# Patient Record
Sex: Female | Born: 1950 | Race: White | Hispanic: No | Smoking: Never smoker
Health system: Southern US, Community
[De-identification: ages and names within clinical notes are randomized; demographics above are authoritative.]

## PROBLEM LIST (undated history)

## (undated) DIAGNOSIS — C50919 Malignant neoplasm of unspecified site of unspecified female breast: Secondary | ICD-10-CM

## (undated) DIAGNOSIS — Z1379 Encounter for other screening for genetic and chromosomal anomalies: Principal | ICD-10-CM

## (undated) DIAGNOSIS — M1990 Unspecified osteoarthritis, unspecified site: Secondary | ICD-10-CM

## (undated) DIAGNOSIS — C801 Malignant (primary) neoplasm, unspecified: Secondary | ICD-10-CM

## (undated) DIAGNOSIS — Z923 Personal history of irradiation: Secondary | ICD-10-CM

## (undated) DIAGNOSIS — K759 Inflammatory liver disease, unspecified: Secondary | ICD-10-CM

## (undated) HISTORY — DX: Encounter for other screening for genetic and chromosomal anomalies: Z13.79

## (undated) HISTORY — PX: BREAST LUMPECTOMY: SHX2

## (undated) HISTORY — PX: ANTERIOR CRUCIATE LIGAMENT REPAIR: SHX115

---

## 1999-01-29 ENCOUNTER — Other Ambulatory Visit: Admission: RE | Admit: 1999-01-29 | Discharge: 1999-01-29 | Payer: Self-pay | Admitting: Obstetrics and Gynecology

## 2000-03-02 ENCOUNTER — Other Ambulatory Visit: Admission: RE | Admit: 2000-03-02 | Discharge: 2000-03-02 | Payer: Self-pay | Admitting: Obstetrics and Gynecology

## 2000-06-23 ENCOUNTER — Ambulatory Visit (HOSPITAL_COMMUNITY): Admission: RE | Admit: 2000-06-23 | Discharge: 2000-06-23 | Payer: Self-pay | Admitting: Gastroenterology

## 2001-06-20 ENCOUNTER — Other Ambulatory Visit: Admission: RE | Admit: 2001-06-20 | Discharge: 2001-06-20 | Payer: Self-pay | Admitting: Obstetrics and Gynecology

## 2002-09-19 ENCOUNTER — Other Ambulatory Visit: Admission: RE | Admit: 2002-09-19 | Discharge: 2002-09-19 | Payer: Self-pay | Admitting: Obstetrics and Gynecology

## 2003-09-10 ENCOUNTER — Other Ambulatory Visit: Admission: RE | Admit: 2003-09-10 | Discharge: 2003-09-10 | Payer: Self-pay | Admitting: Obstetrics and Gynecology

## 2010-02-11 ENCOUNTER — Ambulatory Visit: Payer: Self-pay

## 2010-02-11 ENCOUNTER — Encounter: Payer: Self-pay | Admitting: Cardiovascular Disease

## 2010-02-11 DIAGNOSIS — M79609 Pain in unspecified limb: Secondary | ICD-10-CM | POA: Insufficient documentation

## 2010-11-27 NOTE — Miscellaneous (Signed)
Summary: Orders Update  Clinical Lists Changes  Problems: Added new problem of LEG PAIN (ICD-729.5) Orders: Added new Test order of Venous Duplex Lower Extremity (Venous Duplex Lower) - Signed

## 2011-03-13 NOTE — Procedures (Signed)
[facility]  Patient:    Jessica Ingram, Jessica Ingram                        MRN: [id number] Proc. Date: 06/23/00 Adm. Date:  [id number] Attending:  Charna Elizabeth CC:         Jessica Ingram, M.D.                           Procedure Report  DATE OF BIRTH:  10-13-1951  PROCEDURE PERFORMED:  Colonoscopy.  ENDOSCOPIST:  Anselmo Rod, M.D.  INSTRUMENT USED:  Olympus video colonoscope.  INDICATION FOR PROCEDURE:  Abnormal rectal exam with question of a polyp on rectal exam in a 60 year old white female, rule out colonic polyps, masses, hemorrhoids, et Karie Soda.  PREPROCEDURE PREPARATION:  Informed consent was procured from the patient. The patient was fasted for eight hours prior to the procedure and prepped with a bottle of Magnesium Citrate and a gallon of NuLYTELY the night prior to procedure.  PREPROCEDURE PHYSICAL:  VITAL SIGNS:  The patient had stable vital signs. NECK:  Supple.  CHEST:  Clear to auscultation.  HEART:  S1, S2 regular. ABDOMEN:  Soft, with normal abdominal bowel sounds.  DESCRIPTION OF PROCEDURE:  The patient was placed in the left lateral decubitus position and sedated with 80 mg of Demerol and 8 mg of Versed intravenously.  Once the patient was adequately sedated, maintained on low flow oxygen and continuous cardiac monitoring, the Olympus video colonoscope was advanced from the rectum to the cecum with slight difficulty because of extreme spasm in the left colon.  The patient had extensive left-sided diverticular disease with stool in some of the diverticular pockets.  No masses, polyps, erosions, ulcerations were seen.  She also had small internal hemorrhoids.  On examination of the rectum, no masses or polyps were seen. The patient tolerated the procedure well without complication.  The rest of the colon up to the cecum appeared normal.  The cecum, right colon and transverse colon were healthy without any  abnormalities.  IMPRESSION: 1.  Left-sided diverticular disease. 2.  No large masses or polyps seen. 3.  Small internal hemorrhoids on retroflexion in the rectum.  No colonic     masses or polyps seen and the rectum was also free of masses.  RECOMMENDATION: 1.  The patient will be advised to increase her fluid and fiber in her diet.     A high fiber diet has been discussed with her in great detail and     brochures have been given to her for education. 2.  She is to follow up Dr. Rosalio Ingram and contact me for further GI problems. DD:  06/23/00 TD:  06/23/00 Job: [id number]

## 2014-11-15 ENCOUNTER — Other Ambulatory Visit: Payer: Self-pay | Admitting: Gastroenterology

## 2014-11-15 DIAGNOSIS — R1032 Left lower quadrant pain: Secondary | ICD-10-CM

## 2014-11-19 ENCOUNTER — Ambulatory Visit
Admission: RE | Admit: 2014-11-19 | Discharge: 2014-11-19 | Disposition: A | Discharge status: Home / Self Care | Payer: BLUE CROSS/BLUE SHIELD | Source: Ambulatory Visit | Attending: Gastroenterology | Admitting: Gastroenterology

## 2014-11-19 DIAGNOSIS — R1032 Left lower quadrant pain: Secondary | ICD-10-CM

## 2014-11-19 MED ORDER — IOHEXOL 300 MG/ML  SOLN
100.0000 mL | Freq: Once | INTRAMUSCULAR | Status: AC | PRN
  Administered 2014-11-19: 100 mL via INTRAVENOUS

## 2016-10-26 HISTORY — PX: BREAST BIOPSY: SHX20

## 2017-01-13 ENCOUNTER — Telehealth: Payer: Self-pay | Admitting: *Deleted

## 2017-01-13 NOTE — Telephone Encounter (Signed)
Left message for a return phone call to schedule for [facility] 3/28.

## 2017-01-14 ENCOUNTER — Telehealth: Payer: Self-pay | Admitting: *Deleted

## 2017-01-14 NOTE — Telephone Encounter (Signed)
Confirmed [facility] for 01/20/17 at 1215pm .  Instructions and contact information given.

## 2017-01-15 ENCOUNTER — Other Ambulatory Visit: Payer: Self-pay | Admitting: *Deleted

## 2017-01-15 DIAGNOSIS — Z170 Estrogen receptor positive status [ER+]: Secondary | ICD-10-CM

## 2017-01-15 DIAGNOSIS — C50211 Malignant neoplasm of upper-inner quadrant of right female breast: Secondary | ICD-10-CM | POA: Insufficient documentation

## 2017-01-20 ENCOUNTER — Ambulatory Visit (HOSPITAL_BASED_OUTPATIENT_CLINIC_OR_DEPARTMENT_OTHER): Payer: BLUE CROSS/BLUE SHIELD | Admitting: Oncology

## 2017-01-20 ENCOUNTER — Encounter: Payer: Self-pay | Admitting: Physical Therapy

## 2017-01-20 ENCOUNTER — Ambulatory Visit: Payer: BLUE CROSS/BLUE SHIELD | Attending: General Surgery | Admitting: Physical Therapy

## 2017-01-20 ENCOUNTER — Encounter: Payer: Self-pay | Admitting: Radiation Oncology

## 2017-01-20 ENCOUNTER — Other Ambulatory Visit (HOSPITAL_BASED_OUTPATIENT_CLINIC_OR_DEPARTMENT_OTHER): Payer: BLUE CROSS/BLUE SHIELD

## 2017-01-20 ENCOUNTER — Ambulatory Visit
Admission: RE | Admit: 2017-01-20 | Discharge: 2017-01-20 | Disposition: A | Discharge status: Home / Self Care | Payer: BLUE CROSS/BLUE SHIELD | Source: Ambulatory Visit | Attending: Radiation Oncology | Admitting: Radiation Oncology

## 2017-01-20 ENCOUNTER — Other Ambulatory Visit: Payer: Self-pay | Admitting: *Deleted

## 2017-01-20 ENCOUNTER — Encounter: Payer: Self-pay | Admitting: Oncology

## 2017-01-20 VITALS — BP 148/82 | HR 82 | Temp 97.6°F | Resp 18 | Ht 66.0 in | Wt 137.5 lb

## 2017-01-20 DIAGNOSIS — R293 Abnormal posture: Secondary | ICD-10-CM

## 2017-01-20 DIAGNOSIS — C50211 Malignant neoplasm of upper-inner quadrant of right female breast: Secondary | ICD-10-CM

## 2017-01-20 DIAGNOSIS — Z170 Estrogen receptor positive status [ER+]: Secondary | ICD-10-CM

## 2017-01-20 LAB — CBC WITH DIFFERENTIAL/PLATELET
BASO%: 0 % (ref 0.0–2.0)
Basophils Absolute: 0 10*3/uL (ref 0.0–0.1)
EOS%: 0.4 % (ref 0.0–7.0)
Eosinophils Absolute: 0 10*3/uL (ref 0.0–0.5)
HCT: 40.7 % (ref 34.8–46.6)
HGB: 14.1 g/dL (ref 11.6–15.9)
LYMPH%: 31.1 % (ref 14.0–49.7)
MCH: 33.1 pg (ref 25.1–34.0)
MCHC: 34.6 g/dL (ref 31.5–36.0)
MCV: 95.5 fL (ref 79.5–101.0)
MONO#: 0.4 10*3/uL (ref 0.1–0.9)
MONO%: 9.6 % (ref 0.0–14.0)
NEUT#: 2.7 10*3/uL (ref 1.5–6.5)
NEUT%: 58.9 % (ref 38.4–76.8)
Platelets: 183 10*3/uL (ref 145–400)
RBC: 4.26 10*6/uL (ref 3.70–5.45)
RDW: 12.3 % (ref 11.2–14.5)
WBC: 4.6 10*3/uL (ref 3.9–10.3)
lymph#: 1.4 10*3/uL (ref 0.9–3.3)

## 2017-01-20 LAB — COMPREHENSIVE METABOLIC PANEL
ALT: 20 U/L (ref 0–55)
AST: 20 U/L (ref 5–34)
Albumin: 4.7 g/dL (ref 3.5–5.0)
Alkaline Phosphatase: 58 U/L (ref 40–150)
Anion Gap: 11 mEq/L (ref 3–11)
BUN: 12.9 mg/dL (ref 7.0–26.0)
CO2: 26 mEq/L (ref 22–29)
Calcium: 9.9 mg/dL (ref 8.4–10.4)
Chloride: 103 mEq/L (ref 98–109)
Creatinine: 1 mg/dL (ref 0.6–1.1)
EGFR: 63 mL/min/{1.73_m2} — ABNORMAL LOW (ref >90)
Glucose: 138 mg/dl (ref 70–140)
Potassium: 3.9 mEq/L (ref 3.5–5.1)
Sodium: 140 mEq/L (ref 136–145)
Total Bilirubin: 0.58 mg/dL (ref 0.20–1.20)
Total Protein: 7.9 g/dL (ref 6.4–8.3)

## 2017-01-20 NOTE — Progress Notes (Addendum)
Radiation Oncology         [phone] ________________________________  Name: [name] MRN: [id number]  Date: 01/20/2017  DOB: July 05, 1951    Attestation Please see the note from Shona Simpson, PA-C from today's visit for more details of today's encounter.  I have personally performed a face to face diagnostic evaluation on this patient and devised the following assessment and plan.  The patient was seen today in multidisciplinary breast clinic. She has what appears to be an early right-sided breast tumor, 1.1 cm with no suspicious axillary lymph nodes on ultrasound. For this side, the patient appears to be a good candidate for breast conservation treatment. She will undergo an MRI scan and at this time it is anticipated that she will proceed with a right-sided lumpectomy and sentinel lymph node biopsy. An Oncotype test will be ordered. At this time, she appears to be a good candidate for a hypo-fractionated course of radiation treatment to the right breast.  The patient also has a potential abnormality within the left breast. This is going to be worked up further and likely the patient will undergo a lumpectomy on this side as well with the benign findings on biopsy being felt to be discordant with imaging findings. We discussed that if she does have breast cancer confirmed on this side, then she may benefit from adjuvant radiation treatment to this site post lumpectomy as well.   Staging: cT1cN0M0 right breast  Kyung Rudd, MD    GQ:[id number], Loma Sousa, PA-C  Fanny Skates, MD     REFERRING PHYSICIAN: Fanny Skates, MD   DIAGNOSIS: The encounter diagnosis was Malignant neoplasm of upper-inner quadrant of right breast in female, estrogen receptor positive ([place]).   HISTORY OF PRESENT ILLNESS: Jessica Ingram is a 66 y.o. female seen for a new diagnosis of right breast cancer. The patient was found to have screening dectected abnormality of bilateral breasts which prompted  ultrasound. Within the right breast there was a 1.1 cm mass, and the axilla was negative. On the left, there is a 9 mm lesion as well, and axilla was also negative. 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 The left breast biopsy revealed fibrocystic change and was felt to be discordant. She comes today for evaluation and for recommendations of care for her breast cancer.   PREVIOUS RADIATION THERAPY: No   PAST MEDICAL HISTORY:  Past Medical History:  Diagnosis Date  . Gestational diabetes        PAST SURGICAL HISTORY: Past Surgical History:  Procedure Laterality Date  . ANTERIOR CRUCIATE LIGAMENT REPAIR Left      FAMILY HISTORY:  Family History  Problem Relation Age of Onset  . Lung cancer Mother   . Prostate cancer Father   . Colon cancer Brother   . Breast cancer Maternal Aunt   . Colon cancer Maternal Aunt      SOCIAL HISTORY:  reports that she has never smoked. She has never used smokeless tobacco. She reports that she drinks alcohol. The patient is married and lives in [place]. She works in a Sales promotion account executive.    ALLERGIES: Patient has no known allergies.   MEDICATIONS:  No current outpatient prescriptions on file.   No current facility-administered medications for this encounter.      REVIEW OF SYSTEMS: On review of systems, the patient reports that she is doing well overall. She denies any chest pain, shortness of breath, cough,  fevers, chills, night sweats, unintended weight changes. She denies any bowel or bladder disturbances, and denies abdominal pain, nausea or vomiting. She denies any new musculoskeletal or joint aches or pains. A complete review of systems is obtained and is otherwise negative.     PHYSICAL EXAM:  Wt Readings from Last 3 Encounters:  01/20/17 137 lb 8 oz (62.4 kg)   Temp Readings from Last 3 Encounters:  01/20/17 97.6 F (36.4 C)  (Oral)   BP Readings from Last 3 Encounters:  01/20/17 (!) 148/82   Pulse Readings from Last 3 Encounters:  01/20/17 82     In general this is a well appearing Caucasian female in no acute distress. She is alert and oriented x4 and appropriate throughout the examination. HEENT reveals that the patient is normocephalic, atraumatic. EOMs are intact. PERRLA. Skin is intact without any evidence of gross lesions. Cardiovascular exam reveals a regular rate and rhythm, no clicks rubs or murmurs are auscultated. Chest is clear to auscultation bilaterally. Lymphatic assessment is performed and does not reveal any adenopathy in the cervical, supraclavicular, axillary, or inguinal chains. Bilateral breasts are examined and exhibit post biopsy change. There is induration of the right breast deep to the biopsy site consistent with her tumor, though this is somewhat subtle. No nipple bleeding or discharge is noted. Abdomen has active bowel sounds in all quadrants and is intact. The abdomen is soft, non tender, non distended. Lower extremities are negative for pretibial pitting edema, deep calf tenderness, cyanosis or clubbing.   ECOG = 0  0 - Asymptomatic (Fully active, able to carry on all predisease activities without restriction)  1 - Symptomatic but completely ambulatory (Restricted in physically strenuous activity but ambulatory and able to carry out work of a light or sedentary nature. For example, light housework, office work)  2 - Symptomatic, <50% in bed during the day (Ambulatory and capable of all self care but unable to carry out any work activities. Up and about more than 50% of waking hours)  3 - Symptomatic, >50% in bed, but not bedbound (Capable of only limited self-care, confined to bed or chair 50% or more of waking hours)  4 - Bedbound (Completely disabled. Cannot carry on any self-care. Totally confined to bed or chair)  5 - Death   Eustace Pen MM, Creech RH, Tormey DC, et al. [phone]).  "Toxicity and response criteria of the [facility] Group". [place] Oncol. 5 (6): 649-55    LABORATORY DATA:  Lab Results  Component Value Date   WBC 4.6 01/20/2017   HGB 14.1 01/20/2017   HCT 40.7 01/20/2017   MCV 95.5 01/20/2017   PLT 183 01/20/2017   Lab Results  Component Value Date   NA 140 01/20/2017   K 3.9 01/20/2017   CO2 26 01/20/2017   Lab Results  Component Value Date   ALT 20 01/20/2017   AST 20 01/20/2017   ALKPHOS 58 01/20/2017   BILITOT 0.58 01/20/2017      RADIOGRAPHY: No results found.     IMPRESSION/PLAN: 1. 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 Dr. Lisbeth Renshaw discusses the pathology findings and reviews the nature of invasive disease. The consensus from the breast conference includes recommendations for bilateral breast MRI. Following this, she will meet back to review this with Dr. Dalbert Batman, and he anticipates breast conservation with lumpectomies bilaterally as well as right sentinel mapping. Again the MRI  of the breast may lead to additional lymph assessment on the left side. Oncotype will be ordered by Dr. Jana Hakim.  Provided that chemotherapy is not indicated, the patient's course would then be followed by external radiotherapy to the breast followed by antiestrogen therapy. We discussed the risks, benefits, short, and long term effects of radiotherapy, and the patient is interested in proceeding. Dr. Lisbeth Renshaw discusses the delivery and logistics of radiotherapy, and he anticipates a course of 4 weeks of radiation. We will see her back about 2 weeks after surgery to move forward with the simulation and planning process and anticipate starting radiotherapy about 4 weeks after surgery.  2. Possible genetic predisposition to malignancy. The patient's brother had a history of colon cancer in his 40s, and is deceased and her maternal aunt also had colon and  breast cancer. Genetic counseling has recommended a referral to see the patient. We discussed this, and the patient is interested in referral.  The above documentation reflects my direct findings during this shared patient visit. Please see the separate note by Dr. Lisbeth Renshaw on this date for the remainder of the patient's plan of care.    Carola Rhine, PAC

## 2017-01-20 NOTE — Patient Instructions (Signed)

## 2017-01-20 NOTE — Progress Notes (Signed)
[facility]  [phone] [phone]     ID: Jessica Ingram DOB: 08/31/1951  MR#: [id number]  ZJI#:[id number]  Patient Care Team: Jarrett Soho, PA-C as PCP - General (Family Medicine) Claud Kelp, MD as Consulting Physician (General Surgery) Lowella Dell, MD as Consulting Physician (Oncology) Dorothy Puffer, MD as Consulting Physician (Radiation Oncology) Charna Elizabeth, MD as Consulting Physician (Gastroenterology) Bufford Buttner, MD as Consulting Physician (Dermatology) Shea Evans, MD as Consulting Physician (Obstetrics and Gynecology) Salvatore Marvel, MD as Consulting Physician (Orthopedic Surgery) Lowella Dell, MD OTHER MD:  CHIEF COMPLAINT: Estrogen receptor positive breast cancer  CURRENT TREATMENT: Awaiting definitive surgery   BREAST CANCER HISTORY: "Jessica Ingram" had bilateral screening mammography with tomography at [facility] 01/01/2017. The breast density was category D. In the right breast superiorly there was a 1.1 cm irregular mass. In the left breast there was an area of possible architectural distortion.  The patient was recalled 01/07/2017 or bilateral diagnostic mammography and bilateral ultrasonography. In the right breast superiorly there was a 1.5 cm irregular mass which was located by sonography in the upper inner quadrant area this measured 1.1 cm.  In the left breast the area of architectural distortion was again noted. By ultrasound this was an irregular mass with indistinct margins in the upper inner quadrant of the left breast.  Biopsy of both the areas in question was performed 01/11/2017. 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 The left breast biopsy showed only fibrocystic changes but this  was felt to be discordant by radiology  The patient's subsequent history is as detailed below  INTERVAL HISTORY: Jessica Ingram was evaluated in the multidisciplinary breast cancer conference 01/20/2017 accompanied by her sister Jessica Ingram. Her case was also presented in the multidisciplinary breast cancer conference that same morning. At that time a preliminary plan was proposed: On the right side, lumpectomy with sentinel lymph node biopsy; on the left side it was felt breast MRI would be helpful. The patient will benefit from radiation and anti-estrogens and is a candidate for genetics  REVIEW OF SYSTEMS: There were no specific symptoms leading to the original mammogram, which was routinely scheduled. The patient denies unusual headaches, visual changes, nausea, vomiting, stiff neck, dizziness, or gait imbalance. There has been no cough, phlegm production, or pleurisy, no chest pain or pressure, and no change in bowel or bladder habits. The patient denies fever, rash, bleeding, unexplained fatigue or unexplained weight loss. A detailed review of systems was otherwise entirely negative.  PAST MEDICAL HISTORY: Past Medical History:  Diagnosis Date  . Gestational diabetes     PAST SURGICAL HISTORY: Past Surgical History:  Procedure Laterality Date  . ANTERIOR CRUCIATE LIGAMENT REPAIR Left     FAMILY HISTORY Family History  Problem Relation Age of Onset  . Lung cancer Mother   . Prostate cancer Father   . Colon cancer Brother   . Breast cancer Maternal Aunt   . Colon cancer Maternal Aunt   The patient's father was diagnosed with prostate cancer at the age of 48. He died from unrelated causes at age 74. The patient's mother was diagnosed with lung cancer at age 65. She was a smoker. She died from unrelated causes at age 78. The patient has one brother, 5 sisters. The brother was diagnosed with  colon cancer at age 1 and died at age 69. There is also a maternal aunt diagnosed with both breast and colon  cancers in her 31s. There is no history of ovarian cancer in the family  GYNECOLOGIC HISTORY:  No LMP recorded. Patient is postmenopausal. Menarche age 28, first live birth age 19, which increases the risk of breast cancer developing. The patient is GX P2. She went through the change of life age 62 and was on hormone replacement until age 51. She also took oral contraceptives remotely for about 12 years with no complications  SOCIAL HISTORY:  Jessica Ingram is widowed, her husband dying from complications of depression in September 2017. 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 The patient has no grandchildren. She is not a church attender     ADVANCED DIRECTIVES:  in place; the patient has named her sister Jessica Ingram as healthcare power of attorney. Jessica Ingram may be reached at [phone]    HEALTH MAINTENANCE: Social History  Substance Use Topics  . Smoking status: Never Smoker  . Smokeless tobacco: Never Used  . Alcohol use Yes     Comment: 7     Colonoscopy: January 2017/man  PAP: 2014  Bone density: 2014? Went over OB/GYN   No Known Allergies  No current outpatient prescriptions on file.   No current facility-administered medications for this visit.     OBJECTIVE: Middle-aged white woman who appears well  Vitals:   01/20/17 1301  BP: (!) 148/82  Pulse: 82  Resp: 18  Temp: 97.6 F (36.4 C)     Body mass index is 22.19 kg/m.    ECOG FS:0 - Asymptomatic  Ocular: Sclerae unicteric, pupils equal, round and reactive to light Ear-nose-throat: Oropharynx clear and moist Lymphatic: No cervical or supraclavicular adenopathy Lungs no rales or rhonchi, good excursion bilaterally Heart regular rate  and rhythm, no murmur appreciated Abd soft, nontender, positive bowel sounds MSK no focal spinal tenderness, no joint edema Neuro: non-focal, well-oriented, appropriate affect Breasts: BOTH BREASTS ARE STATUS POST RECENT BIOPSY. I DO NOT PALPATE A MASS IN EITHER BREAST. THERE ARE NO SKIN OR NIPPLE CHANGES OF CONCERN BEYOND THE EXPECTED MILD ECCHYMOSES. BOTH AXILLAE ARE BENIGN.    LAB RESULTS:  CMP     Component Value Date/Time   NA 140 01/20/2017 1249   K 3.9 01/20/2017 1249   CO2 26 01/20/2017 1249   GLUCOSE 138 01/20/2017 1249   BUN 12.9 01/20/2017 1249   CREATININE 1.0 01/20/2017 1249   CALCIUM 9.9 01/20/2017 1249   PROT 7.9 01/20/2017 1249   ALBUMIN 4.7 01/20/2017 1249   AST 20 01/20/2017 1249   ALT 20 01/20/2017 1249   ALKPHOS 58 01/20/2017 1249   BILITOT 0.58 01/20/2017 1249    No results found for: TOTALPROTELP, ALBUMINELP, A1GS, A2GS, BETS, BETA2SER, GAMS, MSPIKE, SPEI  No results found for: KPAFRELGTCHN, LAMBDASER, [facility]  Lab Results  Component Value Date   WBC 4.6 01/20/2017   NEUTROABS 2.7 01/20/2017   HGB 14.1 01/20/2017   HCT 40.7 01/20/2017   MCV 95.5 01/20/2017   PLT 183 01/20/2017      Chemistry      Component Value Date/Time   NA 140 01/20/2017 1249  K 3.9 01/20/2017 1249   CO2 26 01/20/2017 1249   BUN 12.9 01/20/2017 1249   CREATININE 1.0 01/20/2017 1249      Component Value Date/Time   CALCIUM 9.9 01/20/2017 1249   ALKPHOS 58 01/20/2017 1249   AST 20 01/20/2017 1249   ALT 20 01/20/2017 1249   BILITOT 0.58 01/20/2017 1249       No results found for: LABCA2  No components found for: KCLEXN170  No results for input(s): INR in the last 168 hours.  Urinalysis No results found for: COLORURINE, APPEARANCEUR, LABSPEC, PHURINE, GLUCOSEU, HGBUR, BILIRUBINUR, KETONESUR, PROTEINUR, UROBILINOGEN, NITRITE, LEUKOCYTESUR   STUDIES: No results found.  ELIGIBLE FOR AVAILABLE RESEARCH PROTOCOL: no  ASSESSMENT: 66 y.o. [place] woman  status post right breast upper inner quadrant biopsy 01/11/2017 for a clinical  T1c N0, stage IA invasive ductal carcinoma, grade 1, estrogen and progesterone receptor positive, HER-2 nonamplified, with an MIB-1 of 5%.   (1) definitive surgery pending (right breast conserving surgery with sentinel lymph node sampling)  (a) MRI pending, with question of further evaluation left breast.  (2) Oncotype DX to be obtained from the final surgical sample  (3) adjuvant radiation as appropriate  (4) anti-estrogens to follow at the completion of local treatment  (5) genetics testing scheduled  PLAN: We spent the better part of today's hour-long appointment discussing the biology of breast cancer in general, and the specifics of the patient's tumor in particular. We first reviewed the fact that cancer is not one disease but more than 100 different diseases and that it is important to keep them separate-- otherwise when friends and relatives discuss their own cancer experiences with Jessica Ingram confusion can result. Similarly we explained that if breast cancer spreads to the bone or liver, the patient would not have bone cancer or liver cancer, but breast cancer in the bone and breast cancer in the liver: one cancer in three places-- not 3 different cancers which otherwise would have to be treated in 3 different ways.  We discussed the difference between local and systemic therapy. In terms of loco-regional treatment, lumpectomy plus radiation is equivalent to mastectomy as far as survival is concerned. For this reason, and because the cosmetic results are generally superior, we recommend breast conserving surgery. We also noted that in terms of sequencing of treatments, whether systemic therapy or surgery is done first does not affect the ultimate outcome.  We then discussed the rationale for systemic therapy. There is some risk that this cancer may have already spread to other parts of her body. Patients frequently  ask at this point about bone scans, CAT scans and PET scans to find out if they have occult breast cancer somewhere else. The problem is that in early stage disease we are much more likely to find false positives then true cancers and this would expose the patient to unnecessary procedures as well as unnecessary radiation. Scans cannot answer the question the patient really would like to know, which is whether she has microscopic disease elsewhere in her body. For those reasons we do not recommend them.  Of course we would proceed to aggressive evaluation of any symptoms that might suggest metastatic disease, but that is not the case here.  Next we went over the options for systemic therapy which are anti-estrogens, anti-HER-2 immunotherapy, and chemotherapy. Jessica Ingram does not meet criteria for anti-HER-2 immunotherapy. She is a good candidate for anti-estrogens.  The question of chemotherapy is more complicated. Chemotherapy is most effective in rapidly growing,  aggressive tumors. It is much less effective in low-grade, slow growing cancers, like Jessica Ingram's. For that reason we are going to request an Oncotype from the definitive surgical sample, as suggested by NCCN guidelines. However, Jessica Ingram understands the expectation is that she will not need chemotherapy given her expected excellent prognosis  She qualifies for genetics testing in particular because of concerns regarding possible Lynch syndrome. There is no need to wait on her definitive surgery to have the final genetics results.  Accordingly the plan is to start with breast MRI to clarify the situation on the left side, proceeding with repeat left breast biopsy if necessary, otherwise with excisional biopsy of that area; but in any case right lumpectomy and sentinel lymph node sampling and Oncotype testing.  Jessica Ingram has a good understanding of the overall plan. She agrees with it. She knows the goal of treatment in her case is cure. She will call with any  problems that may develop before her next visit here.  Jessica Cruel, MD   01/22/2017 11:16 AM Medical Oncology and Hematology [facility] [address] [place], [place] [address] Tel. [phone]    Fax. [phone]

## 2017-01-20 NOTE — Therapy (Signed)
[place], [place], [address] Phone: [phone]   Fax:  [phone]  Physical Therapy Evaluation  Patient Details  Name: Jessica Ingram MRN: [id number] Date of Birth: Jan 01, 1951 Referring Provider: Dr. Fanny Skates  Encounter Date: 01/20/2017      PT End of Session - 01/20/17 1404    Visit Number 1   Number of Visits 1   PT Start Time 8416   PT Stop Time 1512   PT Time Calculation (min) 23 min   Activity Tolerance Patient tolerated treatment well   Behavior During Therapy [facility] for tasks assessed/performed      Past Medical History:  Diagnosis Date  . Gestational diabetes     Past Surgical History:  Procedure Laterality Date  . ANTERIOR CRUCIATE LIGAMENT REPAIR Left     There were no vitals filed for this visit.       Subjective Assessment - 01/20/17 1352    Subjective Patient reports she is here today to be seen by her medical team for her newly diagnosed right breast cancer.   Patient is accompained by: Family member   Pertinent History Patient was diagnosed on 01/07/17 with right invasive ductal carcinoma breast cancer. It measures 1.1 cm and is located in the upper inner quadrant. It is ER/PR positive, HER2 negative with a Ki67 of 5%. She also has an area in her left breast that measures 9 mm in the upper inner quadrant. The biopsy on the left side was discordant. She has no comorbidities.   Patient Stated Goals Reduce lymphedema risk and learn post op shoulder ROM HEP   Currently in Pain? No/denies            [facility] PT Assessment - 01/20/17 0001      Assessment   Medical Diagnosis Right breast cancer   Referring Provider Dr. Fanny Skates   Onset Date/Surgical Date 01/07/17   Hand Dominance Right   Prior Therapy none     Precautions   Precautions Other (comment)   Precaution Comments active cancer     Restrictions   Weight Bearing Restrictions No     Balance Screen   Has the patient fallen in the past 6 months No   Has the patient had a decrease in activity level because of a fear of falling?  No   Is the patient reluctant to leave their home because of a fear of falling?  No     Home Social worker Private residence   Living Arrangements Alone   Available Help at Discharge Family     Prior Function   Level of Independence Independent   Vocation Full time employment   Scientist, physiological   Leisure She exercises 3x/week spinning and doing interval training and walk for at least 20 minutes daily     Cognition   Overall Cognitive Status Within Functional Limits for tasks assessed     Posture/Postural Control   Posture/Postural Control Postural limitations   Postural Limitations Forward head;Rounded Shoulders     ROM / Strength   AROM / PROM / Strength AROM;Strength     AROM   AROM Assessment Site Shoulder;Cervical   Right/Left Shoulder Right;Left   Right Shoulder Extension 55 Degrees   Right Shoulder Flexion 151 Degrees   Right Shoulder ABduction 174 Degrees   Right Shoulder Internal Rotation 81 Degrees   Right Shoulder External Rotation 82 Degrees   Left Shoulder Extension 50 Degrees   Left  Shoulder Flexion 150 Degrees   Left Shoulder ABduction 172 Degrees   Left Shoulder Internal Rotation 75 Degrees   Left Shoulder External Rotation 80 Degrees   Cervical Flexion WNL   Cervical Extension WNL   Cervical - Right Side Bend WNL   Cervical - Left Side Bend WNL   Cervical - Right Rotation WNL   Cervical - Left Rotation WNL     Strength   Overall Strength Within functional limits for tasks performed           LYMPHEDEMA/ONCOLOGY QUESTIONNAIRE - 01/20/17 1402      Type   Cancer Type Right breast cancer     Lymphedema Assessments   Lymphedema Assessments Upper extremities     Right Upper Extremity Lymphedema   10 cm Proximal to Olecranon Process 26.2 cm   Olecranon Process 23.8 cm   10 cm Proximal  to Ulnar Styloid Process 21.7 cm   Just Proximal to Ulnar Styloid Process 15.4 cm   Across Hand at [employer] 18.8 cm   At [place] of 2nd Digit 6.4 cm     Left Upper Extremity Lymphedema   10 cm Proximal to Olecranon Process 25.8 cm   Olecranon Process 23.7 cm   10 cm Proximal to Ulnar Styloid Process 20.5 cm   Just Proximal to Ulnar Styloid Process 14.8 cm   Across Hand at [employer] 18.2 cm   At [place] of 2nd Digit 6.1 cm      Patient was instructed today in a home exercise program today for post op shoulder range of motion. These included active assist shoulder flexion in sitting, scapular retraction, wall walking with shoulder abduction, and hands behind head external rotation.  She was encouraged to do these twice a day, holding 3 seconds and repeating 5 times when permitted by her physician.         PT Education - 01/20/17 1402    Education provided Yes   Education Details Lymphedema risk reduction and post op shoulder ROM HEP   Person(s) Educated Patient   Methods Explanation;Demonstration;Handout   Comprehension Returned demonstration;Verbalized understanding              Breast Clinic Goals - 01/20/17 1409      Patient will be able to verbalize understanding of pertinent lymphedema risk reduction practices relevant to her diagnosis specifically related to skin care.   Time 1   Period Days   Status Achieved     Patient will be able to return demonstrate and/or verbalize understanding of the post-op home exercise program related to regaining shoulder range of motion.   Time 1   Period Days   Status Achieved     Patient will be able to verbalize understanding of the importance of attending the postoperative After Breast Cancer Class for further lymphedema risk reduction education and therapeutic exercise.   Time 1   Period Days   Status Achieved              Plan - 01/20/17 1405    Clinical Impression Statement Patient was diagnosed on 01/07/17  with right invasive ductal carcinoma breast cancer. It measures 1.1 cm and is located in the upper inner quadrant. It is ER/PR positive, HER2 negative with a Ki67 of 5%. She also has an area in her left breast that measures 9 mm in the upper inner quadrant. The biopsy on the left side was discordant. She has no comorbidities. Her multidisciplinary medical team met prior to her  assessments to determine a recommended treatment plan. She is planning to have a bilateral lumpectomy with a sentinel node biopsy followed by Oncotype testing, radiation, and anti-estrogen therapy. She may benefit from post op PT to regain shoulder ROM and reduce lymphedema risk. Due to her lack of comorbidities, her eval is of low complexity.   Rehab Potential Excellent   Clinical Impairments Affecting Rehab Potential None   PT Frequency One time visit   PT Treatment/Interventions Therapeutic exercise;Patient/family education   PT Next Visit Plan Will f/u after surgery to determine PT needs   PT Home Exercise Plan Post op shoulder ROM HEP   Consulted and Agree with Plan of Care Patient      Patient will benefit from skilled therapeutic intervention in order to improve the following deficits and impairments:  Postural dysfunction, Decreased knowledge of precautions, Pain, Impaired UE functional use, Decreased range of motion  Visit Diagnosis: Carcinoma of upper-inner quadrant of right breast in female, estrogen receptor positive ([place]) - Plan: PT plan of care cert/re-cert  Abnormal posture - Plan: PT plan of care cert/re-cert   Patient will follow up at outpatient cancer rehab if needed following surgery.  If the patient requires physical therapy at that time, a specific plan will be dictated and sent to the referring physician for approval. The patient was educated today on appropriate basic range of motion exercises to begin post operatively and the importance of attending the After Breast Cancer class following surgery.   Patient was educated today on lymphedema risk reduction practices as it pertains to recommendations that will benefit the patient immediately following surgery.  She verbalized good understanding.  No additional physical therapy is indicated at this time.      Problem List Patient Active Problem List   Diagnosis Date Noted  . Malignant neoplasm of upper-inner quadrant of right breast in female, estrogen receptor positive ([place]) 01/15/2017  . LEG PAIN 02/11/2010   Jessica Ingram, PT 01/20/17 3:17 PM  [place], [place], [address] Phone: [phone]   Fax:  [phone]  Name: Jessica Ingram MRN: [id number] Date of Birth: 21-Sep-1951

## 2017-01-21 ENCOUNTER — Encounter: Payer: Self-pay | Admitting: General Practice

## 2017-01-21 NOTE — Progress Notes (Signed)
[place] Psychosocial Distress Screening Spiritual Care  LVM for Clarise Cruz following Breast Multidisciplinary Clinic to introduce [place] team/resources, review distress screen per protocol, and assess for distress and other psychosocial needs..  The patient scored a 7 on the Psychosocial Distress Thermometer which indicates severe distress.  ON[facility]N DISTRESS SCREENING 01/21/2017  Screening Type Initial Screening  Distress experienced in past week (1-10) 7    Follow up needed: No. LVM encouraging pt to return call.  Mailing handwritten note of introduction and full packet of [place] team/resource availability.  Please also page if immediate needs arise.  Thank you.   [place], [place], [facility] Pager [phone] Voicemail [phone]

## 2017-01-22 ENCOUNTER — Ambulatory Visit (HOSPITAL_COMMUNITY): Admission: RE | Admit: 2017-01-22 | Payer: BLUE CROSS/BLUE SHIELD | Source: Ambulatory Visit

## 2017-01-23 ENCOUNTER — Ambulatory Visit (HOSPITAL_COMMUNITY): Admission: RE | Admit: 2017-01-23 | Payer: BLUE CROSS/BLUE SHIELD | Source: Ambulatory Visit

## 2017-01-23 ENCOUNTER — Ambulatory Visit (HOSPITAL_COMMUNITY)
Admission: RE | Admit: 2017-01-23 | Discharge: 2017-01-23 | Disposition: A | Discharge status: Home / Self Care | Payer: BLUE CROSS/BLUE SHIELD | Source: Ambulatory Visit | Attending: General Surgery | Admitting: General Surgery

## 2017-01-23 DIAGNOSIS — C50211 Malignant neoplasm of upper-inner quadrant of right female breast: Secondary | ICD-10-CM | POA: Insufficient documentation

## 2017-01-23 DIAGNOSIS — Z170 Estrogen receptor positive status [ER+]: Secondary | ICD-10-CM

## 2017-01-23 MED ORDER — GADOBENATE DIMEGLUMINE 529 MG/ML IV SOLN
15.0000 mL | Freq: Once | INTRAVENOUS | Status: AC | PRN
  Administered 2017-01-23: 12 mL via INTRAVENOUS

## 2017-01-23 NOTE — Addendum Note (Signed)
Encounter addended by: Kyung Rudd, MD on: 01/23/2017  3:59 PM<BR>    Actions taken: Sign clinical note

## 2017-01-25 ENCOUNTER — Telehealth: Payer: Self-pay | Admitting: *Deleted

## 2017-01-25 NOTE — Telephone Encounter (Signed)
Spoke with pt concerning [place] from 3.28.18. Denies questions or concerns regarding dx or treatment care plan. Encourage pt to call with needs. Received verbal understanding.

## 2017-01-27 ENCOUNTER — Other Ambulatory Visit: Payer: Self-pay | Admitting: General Surgery

## 2017-01-27 DIAGNOSIS — N63 Unspecified lump in unspecified breast: Secondary | ICD-10-CM

## 2017-02-02 ENCOUNTER — Other Ambulatory Visit: Payer: BLUE CROSS/BLUE SHIELD

## 2017-02-02 ENCOUNTER — Ambulatory Visit (HOSPITAL_BASED_OUTPATIENT_CLINIC_OR_DEPARTMENT_OTHER): Payer: BLUE CROSS/BLUE SHIELD | Admitting: Genetics

## 2017-02-02 ENCOUNTER — Encounter: Payer: Self-pay | Admitting: Genetics

## 2017-02-02 DIAGNOSIS — Z170 Estrogen receptor positive status [ER+]: Secondary | ICD-10-CM | POA: Diagnosis not present

## 2017-02-02 DIAGNOSIS — Z7183 Encounter for nonprocreative genetic counseling: Secondary | ICD-10-CM

## 2017-02-02 DIAGNOSIS — C50911 Malignant neoplasm of unspecified site of right female breast: Secondary | ICD-10-CM | POA: Diagnosis not present

## 2017-02-02 DIAGNOSIS — Z800 Family history of malignant neoplasm of digestive organs: Secondary | ICD-10-CM

## 2017-02-02 DIAGNOSIS — Z803 Family history of malignant neoplasm of breast: Secondary | ICD-10-CM

## 2017-02-02 NOTE — Progress Notes (Signed)
REFERRING PROVIDER: Chauncey Cruel, MD [address] [place], [place] [address]  PRIMARY PROVIDER:  Marda Stalker, PA-C  PRIMARY REASON FOR VISIT:  1. Malignant neoplasm of right breast in female, estrogen receptor positive, unspecified site of breast ([place])   2. Family history of colon cancer   3. Family history of breast cancer     HISTORY OF PRESENT ILLNESS:   Jessica Ingram, a 66 y.o. female, was seen for a [facility] cancer genetics consultation at the request of Dr. Jana Hakim due to a personal and family history of cancer.  Jessica Ingram presents to clinic today to discuss the possibility of a hereditary predisposition to cancer, genetic testing, and to further clarify her future cancer risks, as well as potential cancer risks for family members. She was accompanied to her appointment by her sister, Rod Holler.  In March 2018, at the age of 38, Jessica Ingram was diagnosed with ER/PR+ HER2- invasive ductal carcinoma of the right breast. Her treatment is pending. A biopsy of a lesion in her left breast is scheduled for this Friday.   CANCER HISTORY:   No history exists.    HORMONAL RISK FACTORS:  Menarche was at age 7.  First live birth at age 49.  OCP use for approximately 12 years.  Ovaries intact: yes.  Hysterectomy: no.  Menopausal status: postmenopausal.  HRT use: 3 years. Colonoscopy: yes; abnormal. History of polyps. Advised by GI to have colonoscopy every 3 years. Mammogram within the last year: yes. Number of breast biopsies: 2. Up to date with pelvic exams:  yes. Last in 2014. Any excessive radiation exposure in the past:  no  Past Medical History:  Diagnosis Date  . Gestational diabetes     Past Surgical History:  Procedure Laterality Date  . ANTERIOR CRUCIATE LIGAMENT REPAIR Left     Social History   Social History  . Marital status: Widowed    Spouse name: N/A  . Number of children: N/A  . Years of education: N/A   Social History Main Topics  .  Smoking status: Never Smoker  . Smokeless tobacco: Never Used  . Alcohol use Yes     Comment: 7  . Drug use: Unknown  . Sexual activity: Not on file   Other Topics Concern  . Not on file   Social History Narrative  . No narrative on file     FAMILY HISTORY:  We obtained a detailed, 4-generation family history.  Significant diagnoses are listed below: Family History  Problem Relation Age of Onset  . Lung cancer Mother 51    d.93 history of smoking  . Prostate cancer Father 11    d.83 prostate cancer metastasized to bone  . Colon cancer Brother 81    d.53  . Breast cancer Maternal Aunt 73    d.85s  . Colon cancer Maternal Aunt 83  . Bone cancer Paternal Aunt     d.85  . Prostate cancer Paternal Uncle 11    d.72s metastasized to bladder  . Kidney cancer Maternal Grandfather 40    d.53 possible colon cancer  . Lung cancer Paternal Uncle 12    d.60  . Brain cancer Cousin 61    d.11 paternal first-cousin. Son of uncle with prostate cancer.  . Prostate cancer Paternal Grandfather     d.89   Jessica Ingram has two daughters, ages 27 and 32, without cancers. Jessica Ingram has one brother and five sisters. Her brother was diagnosed with metastatic colon cancer at  age 64 and died at age 48. Her sister, Rod Holler, has a history of basal cell and squamous cell carcinomas. Ruth's daughter has a history of a benign thyroid tumor. The rest of Jessica Ingram siters are in their 51s, 82s, and 37s without cancers. All of her sisters have a reported history of colon polyps.  Jessica Ingram mother was diagnosed with lung cancer at 49 and died at 17. She had a history of smoking. Jessica Ingram mother had two sisters. One died at age 77 from a congenital heart defect. The other sister died at age 70 and had a history of breast cancer at 36 and colon cancer at 28. Jessica Ingram maternal grandmother died at 30 without cancer. Jessica Ingram maternal grandfather died at 24 with kidney cancer, however, the family suspects  that he may have had colon cancer as well. This grandfather had two nieces with colon cancer. One was diagnosed in her late-30s. The other was diagnosed in her 56s. The niece with colon cancer in her late-30s had a daughter with breast cancer in her early-30s. These individuals would be first-cousins-once-removed and second cousins to Jessica Ingram.  Jessica Ingram's father died at 30 and had prostate cancer at 82 that metastasized to his bones. Her father had two sisters and three brothers. One sister had bone cancer and died at 49. One brother had prostate cancer that metastasized to his bladder and died in his early-70s. This man's son died at 58 from a brain tumor. Another brother died of lung cancer at 60. The third brother died as a child in a car accident. [name]'s paternal grandmother died at 39 without cancer. Her paternal grandfather died at 74 with untreated prostate cancer.  Jessica Ingram is unaware of previous family history of genetic testing for hereditary cancer risks. Patient's maternal ancestors are of [place] descent, and paternal ancestors are of [place] descent. There is no reported Ashkenazi Jewish ancestry. There is no known consanguinity.  GENETIC COUNSELING ASSESSMENT: Jessica Ingram is a 66 y.o. female with a personal and family history which is somewhat suggestive of a hereditary cancer syndrome and predisposition to cancer. We, therefore, discussed and recommended the following at today's visit.   DISCUSSION: We reviewed the characteristics, features and inheritance patterns of hereditary cancer syndromes. We also discussed genetic testing, including the appropriate family members to test, the process of testing, insurance coverage and turn-around-time for results. We discussed the implications of a negative, positive and/or variant of uncertain significant result. We recommended Jessica Ingram pursue genetic testing for the 46-gene Common Hereditary Cancers panel offered by [facility].    Based on Ms. Buehrle's personal and family history of cancer, she meets medical criteria for genetic testing. Despite that she meets criteria, she may still have an out of pocket cost. We discussed that if her out of pocket cost for testing is over $100, the laboratory will call and confirm whether she wants to proceed with testing.  If the out of pocket cost of testing is less than $100 she will be billed by the genetic testing laboratory.   PLAN: After considering the risks, benefits, and limitations, Ms. Kielty  provided informed consent to pursue genetic testing and the blood sample was sent to [facility] for analysis of the 46-gene Common Hereditary Cancers Panel. Results should be available within approximately 3 weeks' time, at which point they will be disclosed by telephone to Ms. Hail, as will any additional recommendations warranted by these results. Ms. Warshawsky will receive  a summary of her genetic counseling visit and a copy of her results once available. This information will also be available in Epic.   Lastly, we encouraged Ms. Brander to remain in contact with cancer genetics annually so that we can continuously update the family history and inform her of any changes in cancer genetics and testing that may be of benefit for this family.   Ms.  Kachmar questions were answered to her satisfaction today. Our contact information was provided should additional questions or concerns arise. Thank you for the referral and allowing [place] to share in the care of your patient.   Mal Misty, MS, [facility] Certified Naval [email or web address] phone: [phone]  The patient was seen for a total of 45 minutes in face-to-face genetic counseling.   _______________________________________________________________________ For Office Staff:  Number of people involved in session: 2 Was an Intern/ student involved with case: no

## 2017-02-05 ENCOUNTER — Ambulatory Visit
Admission: RE | Admit: 2017-02-05 | Discharge: 2017-02-05 | Disposition: A | Discharge status: Home / Self Care | Payer: BLUE CROSS/BLUE SHIELD | Source: Ambulatory Visit | Attending: General Surgery | Admitting: General Surgery

## 2017-02-05 ENCOUNTER — Other Ambulatory Visit: Payer: Self-pay

## 2017-02-05 ENCOUNTER — Other Ambulatory Visit: Payer: Self-pay | Admitting: Diagnostic Radiology

## 2017-02-05 DIAGNOSIS — N63 Unspecified lump in unspecified breast: Secondary | ICD-10-CM

## 2017-02-05 HISTORY — PX: BREAST BIOPSY: SHX20

## 2017-02-05 MED ORDER — GADOBENATE DIMEGLUMINE 529 MG/ML IV SOLN
12.0000 mL | Freq: Once | INTRAVENOUS | Status: AC | PRN
  Administered 2017-02-05: 12 mL via INTRAVENOUS

## 2017-02-11 ENCOUNTER — Other Ambulatory Visit: Payer: Self-pay | Admitting: General Surgery

## 2017-02-11 DIAGNOSIS — C50411 Malignant neoplasm of upper-outer quadrant of right female breast: Secondary | ICD-10-CM

## 2017-02-19 ENCOUNTER — Telehealth: Payer: Self-pay | Admitting: Genetics

## 2017-02-19 ENCOUNTER — Ambulatory Visit: Payer: Self-pay | Admitting: Genetics

## 2017-02-19 ENCOUNTER — Encounter: Payer: Self-pay | Admitting: Genetics

## 2017-02-19 DIAGNOSIS — Z1379 Encounter for other screening for genetic and chromosomal anomalies: Secondary | ICD-10-CM

## 2017-02-19 HISTORY — DX: Encounter for other screening for genetic and chromosomal anomalies: Z13.79

## 2017-02-19 NOTE — Telephone Encounter (Deleted)
-----   Message from Mal Misty sent at 02/02/2017  4:52 PM EDT ----- Regarding: Call Results Surgery pending. Remind pt that other family members (siblings) can test, even if she is negative. Route to Dr. Jana Hakim.

## 2017-02-19 NOTE — Progress Notes (Signed)
HPI: Jessica Ingram was previously seen in the [place] clinic due to a personal and family history of cancer and concerns regarding a hereditary predisposition to cancer. Please refer to our prior cancer genetics clinic note for more information regarding Jessica Ingram's medical, social and family histories, and our assessment and recommendations, at the time. Jessica Ingram recent genetic test results were disclosed to her, as were recommendations warranted by these results. These results and recommendations are discussed in more detail below.  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  No history exists.     FAMILY HISTORY:  We obtained a detailed, 4-generation family history.  Significant diagnoses are listed below: Family History  Problem Relation Age of Onset  . Lung cancer Mother 50    d.93 history of smoking  . Prostate cancer Father 86    d.83 prostate cancer metastasized to bone  . Colon cancer Brother 60    d.53  . Breast cancer Maternal Aunt 73    d.85s  . Colon cancer Maternal Aunt 83  . Bone cancer Paternal Aunt     d.85  . Prostate cancer Paternal Uncle 58    d.72s metastasized to bladder  . Kidney cancer Maternal Grandfather 42    d.53 possible colon cancer  . Lung cancer Paternal Uncle 17    d.60  . Brain cancer Cousin 33    d.11 paternal first-cousin. Son of uncle with prostate cancer.  . Prostate cancer Paternal Grandfather     d.89   Jessica Ingram has two daughters, ages 83 and 50, without cancers. Jessica Ingram has one brother and five sisters. Her brother was diagnosed with metastatic colon cancer at age 27 and died at age 13. Her sister, Jessica Ingram, has a history of basal cell and squamous cell carcinomas. Jessica Ingram's daughter has a history of a benign thyroid tumor. The rest of Jessica Ingram siters are in their 5s, 55s, and 67s without cancers. All of her sisters have a reported  history of colon polyps.  Jessica Ingram mother was diagnosed with lung cancer at 39 and died at 6. She had a history of smoking. Jessica Ingram mother had two sisters. One died at age 83 from a congenital heart defect. The other sister died at age 40 and had a history of breast cancer at 1 and colon cancer at 61. Jessica Ingram maternal grandmother died at 21 without cancer. Jessica Ingram maternal grandfather died at 90 with kidney cancer, however, the family suspects that he may have had colon cancer as well. This grandfather had two nieces with colon cancer. One was diagnosed in her late-30s. The other was diagnosed in her 81s. The niece with colon cancer in her late-30s had a daughter with breast cancer in her early-30s. These individuals would be first-cousins-once-removed and second cousins to Jessica Ingram.  Jessica Ingram's father died at 14 and had prostate cancer at 4 that metastasized to his bones. Her father had two sisters and three brothers. One sister had bone cancer and died at 20. One brother had prostate cancer that metastasized to his bladder and died in his early-70s. This man's son died at 67 from a brain tumor. Another brother died of lung cancer at 48. The third brother died as a child in a car accident. Jessica Ingram's paternal grandmother died at 73 without cancer. Her paternal grandfather died at 33 with untreated prostate cancer.  Jessica Ingram is unaware of previous family history of genetic testing for hereditary cancer risks. Patient's maternal ancestors are of [place] descent, and paternal ancestors are of [place] descent. There is no reported Ashkenazi Jewish ancestry. There is no known consanguinity.  GENETIC TEST RESULTS: Genetic testing performed through [facility]'s Common Hereditary Cancers Panel reported out on 02/15/2017 showed no deleterious mutations.  [facility]'s 46-gene Common Hereditary Cancers Panel analyzed the following genes: APC, ATM, AXIN2, BARD1, BMPR1A, BRCA1, BRCA2,  BRIP1, CDH1, CDKN2A, CHEK2, CTNNA1, DICER1, EPCAM, GREM1, HOXB13, KIT, MEN1, MLH1, MSH2, MSH3, MSH6, MUTYH, NBN, NF1, NTHL1, PALB2, PDGFRA, PMS2, POLD1, POLE, PTEN, RAD50, RAD51C, RAD51D, SDHA, SDHB, SDHC, SDHD, SMAD4, SMARCA4, STK11, TP53, TSC1, TSC2, and VHL.   Variants of uncertain significance (VUSs) were identified in three genes. The specific gene and corresponding variant are listed below. VUSs should not be used for clinical decision making or medical management. CTNNA1 c.410G>A (p.Arg137Gln) DICER1 c.2027G>C (p.Arg676Pro) TSC2 c.4325A>T (p.Glu1442Val)  At this time, it is unknown if these variants are associated with increased cancer risk or if this is a normal finding, but most variants such as this get reclassified to being inconsequential. VUSs should not be used to make medical management decisions. With time, we suspect the lab will determine the significance of these variants, if any. If we do learn more about any of them, we will try to contact Jessica Ingram to discuss it further. However, it is important to stay in touch with [place] periodically and keep the address and phone number up to date.  The test report will be scanned into EPIC and will be located under the Molecular Pathology section of the Results Review tab.A portion of the result report is included below for reference.     We discussed with Ms. Steedley that since the current genetic testing is not perfect, it is possible there may be a gene mutation in one of these genes that current testing cannot detect, but that chance is small. We also discussed, that it is possible that another gene that has not yet been discovered, or that we have not yet tested, is responsible for the cancer diagnoses in the family. Therefore, important to remain in touch with cancer genetics in the future so that we can continue to offer Ms. Wessinger the most up to date genetic testing.   CANCER SCREENING RECOMMENDATIONS: Given Ms. Labrada's personal and  family histories, we must interpret these negative results with some caution.  Families with features suggestive of hereditary risk for cancer tend to have multiple family members with cancer, diagnoses in multiple generations and diagnoses before the age of 3. Ms. Goeser family exhibits some of these features. Thus this result may simply reflect our current inability to detect all mutations within these genes or there may be a different gene that has not yet been discovered or tested. However, since no causative mutations were identified, Ms. Denard's breast cancer treatments and surveillance must be based on other aspects of her diagnosis rather than these genetic testing results. Screening for other cancers should be based on family history and age-based general population recommendations. For example, we reviewed that due to her family history of colon cancer, Ms. Wadley should undergo colonoscopy at least every 5 years unless otherwise advised by her physicians. Ms. Transue should further discuss cancer screenings with her oncology and primary care providers.  RECOMMENDATIONS FOR FAMILY MEMBERS: Due to her brother's young age at colon cancer diagnosis, each of Ms. Creed's siblings are candidates for genetic testing.  Her siblings should discuss their family history of cancers, genetic testing options, and a personalized cancer screening plan with their health care providers.   FOLLOW-UP: Lastly, we discussed with Ms. Lunz that cancer genetics is a rapidly advancing field and it is possible that new genetic tests will be appropriate for her and/or her family members in the future. We encouraged her to remain in contact with cancer genetics on an annual basis so we can update her personal and family histories and let her know of advances in cancer genetics that may benefit this family.   Our contact number was provided. Ms. Oakland questions were answered to her satisfaction, and she knows she is  welcome to call us at anytime with additional questions or concerns.   Mal Misty, MS, [facility] Certified Naval [email or web address]

## 2017-02-19 NOTE — Telephone Encounter (Signed)
Reviewed that germline genetic testing revealed no pathogenic mutations. This is considered to be a negative result. Testing was performed through [facility]'s 46-gene Common Hereditary Cancers Panel. [facility] Common Hereditary Cancers Panel includes analysis of the following 46 genes: APC, ATM, AXIN2, BARD1, BMPR1A, BRCA1, BRCA2, BRIP1, CDH1, CDKN2A, CHEK2, CTNNA1, DICER1, EPCAM, GREM1, HOXB13, KIT, MEN1, MLH1, MSH2, MSH3, MSH6, MUTYH, NBN, NF1, NTHL1, PALB2, PDGFRA, PMS2, POLD1, POLE, PTEN, RAD50, RAD51C, RAD51D, SDHA, SDHB, SDHC, SDHD, SMAD4, SMARCA4, STK11, TP53, TSC1, TSC2, and VHL.  Variants of uncertain significance (VUS) were noted in three genes. The specific gene and associated variants are listed below: CTNNA1 c.410G>A (p.Arg137Gln) DICER1 c.2027G>C (p.Arg676Pro) TSC2 c.4325A>T (p.Glu1442Val)  Discussed that this VUS should not change clinical management.  For more detailed discussion, please see genetic counseling documentation from 02/19/2017. Result report dated 02/15/2017.

## 2017-02-25 ENCOUNTER — Encounter (HOSPITAL_BASED_OUTPATIENT_CLINIC_OR_DEPARTMENT_OTHER): Payer: Self-pay | Admitting: *Deleted

## 2017-03-03 NOTE — Progress Notes (Signed)
Pt given Boost drink with instructions for completion by 0515 DOS. NPO otherwise. Pt verbalized understanding.

## 2017-03-04 NOTE — H&P (Signed)
Jessica Ingram Location: [facility] Patient #: [id number] DOB: 29-Dec-1950 Widowed / Language: Cleophus Molt / Race: White Female       History of Present Illness  The patient is a 66 year old female who presents with breast cancer. This is a 66 year old woman who returns for her second visit regarding her right breast cancer and left breast abnormal mammogram. She is here to discuss her workup findings and schedule definitive surgery. Her PCP is Marda Stalker. Dr. Lisbeth Renshaw and Dr. Jana Hakim are involved in her care. I know her because our children went to [employer] school together.  Last mammogram was 3 years ago. Recent screening mammograms showed bilateral density.  On the right side she had a 1.1 cm mass in the upper outer quadrant 11 o'clock position. Biopsy showed invasive ductal carcinoma, ER/PR strongly positive, HER-2/neu negative, Ki-67 5%.  On the left side there was a small density in the upper inner quadrant at 11:00. Breasts were very dense. Image guided biopsy of the left breast upper inner quadrant showed a 9 mm solid mass no biopsy was fibrocystic changes. The radiologist felt this was discordant and requested that this area be excised. Her right axilla looks normal on ultrasound. Because her breasts are extremely dense and she had multiple abnormalities, MRI was felt to be appropriate and this was the consensus viewpoint of our breast tumor board.   She would like breast conservation surgery. I think she is an excellent candidate for that She'll be scheduled for right breast lumpectomy with radioactive seed localization, right axillary sentinel lymph node biopsy, and left breast lumpectomy with radioactive seed localization in the upper inner quadrant. I discussed the indications, details, techniques, and numerous risk of the surgery with her and her sister. She is aware of the risk of bleeding, infection, cosmetic deformity, reoperation for positive margins  or positive nodes. Risk of nerve damage with chronic pain or numbness, and other unforeseen problems. She understands all these issues well. All of her questions were answered. She agrees with this plan. She knows that she will need radiation therapy on the right side Decisions regarding chemotherapy will wait until the final pathology is in  Past history is negative she is healthy. Family history reveals brother died of colon cancer at age 68. Father had prostate cancers with bone metastasis. Mother died of lung cancer but was a smoker. Paternal aunt had breast cancer and survived but then died of colon cancer. She is a widow with 2 daughters. Works for [employer]. Denies tobacco. Drinks alcohol occasionally.    Allergies  No Known Allergies   Medication History  No Current Medications Medications Reconciled  Vitals  Weight: 136.1 lb Height: 66.5in Body Surface Area: 1.71 m Body Mass Index: 21.64 kg/m  Temp.: 97.46F  Pulse: 68 (Regular)  BP: 140/86 (Sitting, Left Arm, Standard)   Physical Exam  General Mental Status-Alert. General Appearance-Not in acute distress. Build & Nutrition-Well nourished. Posture-Normal posture. Gait-Normal.  Head and Neck Head-normocephalic, atraumatic with no lesions or palpable masses. Trachea-midline. Thyroid Gland Characteristics - normal size and consistency and no palpable nodules.  Chest and Lung Exam Chest and lung exam reveals -on auscultation, normal breath sounds, no adventitious sounds and normal vocal resonance.  Breast Note: Ecchymoses and hematoma left breast upper inner quadrant resolving. Ecchymoses and hematoma right breast upper outer quadrant resolving. Breasts are relatively small, C cup at best. No other masses or skin changes. No axillary adenopathy on either side.   Cardiovascular Cardiovascular  examination reveals -normal heart sounds, regular rate and  rhythm with no murmurs and femoral artery auscultation bilaterally reveals normal pulses, no bruits, no thrills.  Abdomen Inspection Inspection of the abdomen reveals - No Hernias. Palpation/Percussion Palpation and Percussion of the abdomen reveal - Soft, Non Tender, No Rigidity (guarding), No hepatosplenomegaly and No Palpable abdominal masses.  Neurologic Neurologic evaluation reveals -alert and oriented x 3 with no impairment of recent or remote memory, normal attention span and ability to concentrate, normal sensation and normal coordination.  Musculoskeletal Normal Exam - Bilateral-Upper Extremity Strength Normal and Lower Extremity Strength Normal.    Assessment & Plan  PRIMARY CANCER OF UPPER OUTER QUADRANT OF RIGHT FEMALE BREAST (C50.411)   To summarize your findings: In the right breast you have a 1.1 cm invasive ductal carcinoma, estrogen receptor positive, HER-2 negative In the left breast you have a mass in the upper inner quadrant which has been biopsied and revealed benign findings. This is felt to be discordant and excision of this area is recommended In the left breast, lower outer quadrant a third density was found on MRI. This has been biopsied and shows benign fibrocystic change. This is concordant and nothing further needs to be done Your MRI shows no other masses and there is no evidence of any enlarged lymph nodes  There is no benefit to mastectomy. We have both agreed that you will undergo right breast lumpectomy with radioactive seed localization, right axillary sentinel lymph node biopsy, and left breast lumpectomy with radioactive seed localization. We have discussed the indications, techniques, and risk of the surgery in detail  ABNORMAL MAMMOGRAM OF LEFT BREAST (R92.8) Impression: Biopsy upper inner quadrant shows fibrocystic change. Felt to be discordant by radiologist. Excision recommended  TRAUMATIC HEMATOMA OF FEMALE BREAST, LEFT, INITIAL  ENCOUNTER (S20.02XA) MASS OF LOWER OUTER QUADRANT OF LEFT BREAST (D47.18) Impression: Biopsy benign and concordant. Nothing further recommended in this area    [facility]. Dalbert Batman, M.D., [facility] General and Minimally invasive Surgery Breast and Colorectal Surgery Office:   [phone] Pager:   [phone]

## 2017-03-05 ENCOUNTER — Ambulatory Visit (HOSPITAL_COMMUNITY)
Admission: RE | Admit: 2017-03-05 | Discharge: 2017-03-05 | Disposition: A | Discharge status: Home / Self Care | Payer: BLUE CROSS/BLUE SHIELD | Source: Ambulatory Visit | Attending: General Surgery | Admitting: General Surgery

## 2017-03-05 ENCOUNTER — Encounter (HOSPITAL_BASED_OUTPATIENT_CLINIC_OR_DEPARTMENT_OTHER): Payer: Self-pay | Admitting: Anesthesiology

## 2017-03-05 ENCOUNTER — Ambulatory Visit (HOSPITAL_BASED_OUTPATIENT_CLINIC_OR_DEPARTMENT_OTHER)
Admission: RE | Admit: 2017-03-05 | Discharge: 2017-03-05 | Disposition: A | Discharge status: Home / Self Care | Payer: BLUE CROSS/BLUE SHIELD | Source: Ambulatory Visit | Attending: General Surgery | Admitting: General Surgery

## 2017-03-05 ENCOUNTER — Encounter (HOSPITAL_BASED_OUTPATIENT_CLINIC_OR_DEPARTMENT_OTHER)
Admission: RE | Disposition: A | Discharge status: Home / Self Care | Payer: Self-pay | Source: Ambulatory Visit | Attending: General Surgery

## 2017-03-05 ENCOUNTER — Ambulatory Visit (HOSPITAL_BASED_OUTPATIENT_CLINIC_OR_DEPARTMENT_OTHER): Payer: BLUE CROSS/BLUE SHIELD | Admitting: Anesthesiology

## 2017-03-05 DIAGNOSIS — Z803 Family history of malignant neoplasm of breast: Secondary | ICD-10-CM | POA: Diagnosis not present

## 2017-03-05 DIAGNOSIS — C50411 Malignant neoplasm of upper-outer quadrant of right female breast: Secondary | ICD-10-CM

## 2017-03-05 DIAGNOSIS — C50211 Malignant neoplasm of upper-inner quadrant of right female breast: Secondary | ICD-10-CM

## 2017-03-05 DIAGNOSIS — Z170 Estrogen receptor positive status [ER+]: Secondary | ICD-10-CM | POA: Diagnosis not present

## 2017-03-05 HISTORY — PX: BREAST LUMPECTOMY WITH RADIOACTIVE SEED AND SENTINEL LYMPH NODE BIOPSY: SHX6550

## 2017-03-05 HISTORY — PX: BREAST LUMPECTOMY: SHX2

## 2017-03-05 HISTORY — DX: Unspecified osteoarthritis, unspecified site: M19.90

## 2017-03-05 HISTORY — DX: Inflammatory liver disease, unspecified: K75.9

## 2017-03-05 SURGERY — BREAST LUMPECTOMY WITH RADIOACTIVE SEED AND SENTINEL LYMPH NODE BIOPSY
Anesthesia: General | Site: Breast | Laterality: Bilateral

## 2017-03-05 MED ORDER — ACETAMINOPHEN 325 MG PO TABS
650.0000 mg | ORAL_TABLET | ORAL | Status: DC | PRN
Start: 2017-03-05 — End: 2017-03-05

## 2017-03-05 MED ORDER — DEXAMETHASONE SODIUM PHOSPHATE 4 MG/ML IJ SOLN
INTRAMUSCULAR | Status: DC | PRN
  Administered 2017-03-05: 10 mg via INTRAVENOUS

## 2017-03-05 MED ORDER — GABAPENTIN 300 MG PO CAPS
300.0000 mg | ORAL_CAPSULE | ORAL | Status: AC
  Administered 2017-03-05: 300 mg via ORAL

## 2017-03-05 MED ORDER — MIDAZOLAM HCL 5 MG/5ML IJ SOLN
INTRAMUSCULAR | Status: DC | PRN
  Administered 2017-03-05: 2 mg via INTRAVENOUS

## 2017-03-05 MED ORDER — ACETAMINOPHEN 650 MG RE SUPP: 650.0000 mg | RECTAL | Status: DC | PRN

## 2017-03-05 MED ORDER — SCOPOLAMINE 1 MG/3DAYS TD PT72: 1.0000 | MEDICATED_PATCH | Freq: Once | TRANSDERMAL | Status: DC | PRN

## 2017-03-05 MED ORDER — GABAPENTIN 300 MG PO CAPS
ORAL_CAPSULE | ORAL | Status: AC
  Filled 2017-03-05: qty 1

## 2017-03-05 MED ORDER — DEXAMETHASONE SODIUM PHOSPHATE 10 MG/ML IJ SOLN
INTRAMUSCULAR | Status: AC
  Filled 2017-03-05: qty 1

## 2017-03-05 MED ORDER — METOCLOPRAMIDE HCL 5 MG/ML IJ SOLN: 10.0000 mg | Freq: Once | INTRAMUSCULAR | Status: DC | PRN

## 2017-03-05 MED ORDER — TECHNETIUM TC 99M SULFUR COLLOID FILTERED
1.0000 | Freq: Once | INTRAVENOUS | Status: AC | PRN
  Administered 2017-03-05: 1 via INTRADERMAL

## 2017-03-05 MED ORDER — FENTANYL CITRATE (PF) 100 MCG/2ML IJ SOLN
INTRAMUSCULAR | Status: DC | PRN
  Administered 2017-03-05: 100 ug via INTRAVENOUS
  Administered 2017-03-05 (×3): 25 ug via INTRAVENOUS

## 2017-03-05 MED ORDER — EPHEDRINE SULFATE 50 MG/ML IJ SOLN
INTRAMUSCULAR | Status: DC | PRN
  Administered 2017-03-05: 10 mg via INTRAVENOUS

## 2017-03-05 MED ORDER — FENTANYL CITRATE (PF) 100 MCG/2ML IJ SOLN
INTRAMUSCULAR | Status: AC
  Filled 2017-03-05: qty 2

## 2017-03-05 MED ORDER — PROPOFOL 10 MG/ML IV BOLUS
INTRAVENOUS | Status: AC
  Filled 2017-03-05: qty 20

## 2017-03-05 MED ORDER — FENTANYL CITRATE (PF) 100 MCG/2ML IJ SOLN: 25.0000 ug | INTRAMUSCULAR | Status: DC | PRN

## 2017-03-05 MED ORDER — ONDANSETRON HCL 4 MG/2ML IJ SOLN
INTRAMUSCULAR | Status: AC
  Filled 2017-03-05: qty 2

## 2017-03-05 MED ORDER — LACTATED RINGERS IV SOLN: INTRAVENOUS | Status: DC

## 2017-03-05 MED ORDER — SODIUM CHLORIDE 0.9% FLUSH: 3.0000 mL | INTRAVENOUS | Status: DC | PRN

## 2017-03-05 MED ORDER — BUPIVACAINE-EPINEPHRINE (PF) 0.5% -1:200000 IJ SOLN
INTRAMUSCULAR | Status: DC | PRN
  Administered 2017-03-05: 30 mL via PERINEURAL

## 2017-03-05 MED ORDER — ONDANSETRON HCL 4 MG/2ML IJ SOLN
INTRAMUSCULAR | Status: DC | PRN
  Administered 2017-03-05: 4 mg via INTRAVENOUS

## 2017-03-05 MED ORDER — CEFAZOLIN SODIUM-DEXTROSE 2-4 GM/100ML-% IV SOLN
INTRAVENOUS | Status: AC
  Filled 2017-03-05: qty 100

## 2017-03-05 MED ORDER — CEFAZOLIN SODIUM-DEXTROSE 2-4 GM/100ML-% IV SOLN
2.0000 g | INTRAVENOUS | Status: AC
  Administered 2017-03-05: 2 g via INTRAVENOUS

## 2017-03-05 MED ORDER — MIDAZOLAM HCL 2 MG/2ML IJ SOLN
1.0000 mg | INTRAMUSCULAR | Status: DC | PRN
  Administered 2017-03-05: 2 mg via INTRAVENOUS

## 2017-03-05 MED ORDER — OXYCODONE HCL 5 MG PO TABS
ORAL_TABLET | ORAL | Status: AC
  Filled 2017-03-05: qty 1

## 2017-03-05 MED ORDER — BUPIVACAINE-EPINEPHRINE (PF) 0.5% -1:200000 IJ SOLN
INTRAMUSCULAR | Status: AC
  Filled 2017-03-05: qty 30

## 2017-03-05 MED ORDER — LIDOCAINE HCL (CARDIAC) 20 MG/ML IV SOLN
INTRAVENOUS | Status: DC | PRN
  Administered 2017-03-05: 30 mg via INTRAVENOUS

## 2017-03-05 MED ORDER — SODIUM CHLORIDE 0.9% FLUSH: 3.0000 mL | Freq: Two times a day (BID) | INTRAVENOUS | Status: DC

## 2017-03-05 MED ORDER — MIDAZOLAM HCL 2 MG/2ML IJ SOLN
INTRAMUSCULAR | Status: AC
  Filled 2017-03-05: qty 2

## 2017-03-05 MED ORDER — SODIUM CHLORIDE 0.9 % IV SOLN: 250.0000 mL | INTRAVENOUS | Status: DC | PRN

## 2017-03-05 MED ORDER — MEPERIDINE HCL 25 MG/ML IJ SOLN: 6.2500 mg | INTRAMUSCULAR | Status: DC | PRN

## 2017-03-05 MED ORDER — ACETAMINOPHEN 500 MG PO TABS
ORAL_TABLET | ORAL | Status: AC
  Filled 2017-03-05: qty 1

## 2017-03-05 MED ORDER — METHYLENE BLUE 0.5 % INJ SOLN
INTRAVENOUS | Status: AC
  Filled 2017-03-05: qty 10

## 2017-03-05 MED ORDER — SODIUM CHLORIDE 0.9 % IJ SOLN
INTRAVENOUS | Status: DC | PRN
  Administered 2017-03-05: 5 mL via SUBCUTANEOUS

## 2017-03-05 MED ORDER — OXYCODONE HCL 5 MG PO TABS
5.0000 mg | ORAL_TABLET | ORAL | Status: DC | PRN
  Administered 2017-03-05: 5 mg via ORAL

## 2017-03-05 MED ORDER — CHLORHEXIDINE GLUCONATE CLOTH 2 % EX PADS: 6.0000 | MEDICATED_PAD | Freq: Once | CUTANEOUS | Status: DC

## 2017-03-05 MED ORDER — LACTATED RINGERS IV SOLN
INTRAVENOUS | Status: DC
  Administered 2017-03-05 (×2): via INTRAVENOUS

## 2017-03-05 MED ORDER — HYDROCODONE-ACETAMINOPHEN 5-325 MG PO TABS: 1.0000 | ORAL_TABLET | Freq: Four times a day (QID) | ORAL | 0 refills | Status: DC | PRN

## 2017-03-05 MED ORDER — ACETAMINOPHEN 500 MG PO TABS
ORAL_TABLET | ORAL | Status: AC
  Filled 2017-03-05: qty 2

## 2017-03-05 MED ORDER — BUPIVACAINE HCL (PF) 0.5 % IJ SOLN
INTRAMUSCULAR | Status: AC
  Filled 2017-03-05: qty 30

## 2017-03-05 MED ORDER — CELECOXIB 200 MG PO CAPS
ORAL_CAPSULE | ORAL | Status: AC
  Filled 2017-03-05: qty 2

## 2017-03-05 MED ORDER — SODIUM CHLORIDE 0.9 % IJ SOLN
INTRAMUSCULAR | Status: AC
  Filled 2017-03-05: qty 10

## 2017-03-05 MED ORDER — ACETAMINOPHEN 500 MG PO TABS
1000.0000 mg | ORAL_TABLET | ORAL | Status: AC
  Administered 2017-03-05: 1000 mg via ORAL

## 2017-03-05 MED ORDER — FENTANYL CITRATE (PF) 100 MCG/2ML IJ SOLN
50.0000 ug | INTRAMUSCULAR | Status: DC | PRN
  Administered 2017-03-05: 100 ug via INTRAVENOUS

## 2017-03-05 MED ORDER — BUPIVACAINE HCL (PF) 0.25 % IJ SOLN
INTRAMUSCULAR | Status: AC
  Filled 2017-03-05: qty 30

## 2017-03-05 MED ORDER — PROPOFOL 10 MG/ML IV BOLUS
INTRAVENOUS | Status: DC | PRN
  Administered 2017-03-05: 150 mg via INTRAVENOUS

## 2017-03-05 MED ORDER — EPHEDRINE 5 MG/ML INJ
INTRAVENOUS | Status: AC
  Filled 2017-03-05: qty 10

## 2017-03-05 MED ORDER — LIDOCAINE 2% (20 MG/ML) 5 ML SYRINGE
INTRAMUSCULAR | Status: AC
  Filled 2017-03-05: qty 5

## 2017-03-05 MED ORDER — CELECOXIB 400 MG PO CAPS
400.0000 mg | ORAL_CAPSULE | ORAL | Status: AC
  Administered 2017-03-05: 400 mg via ORAL

## 2017-03-05 MED ORDER — BUPIVACAINE-EPINEPHRINE 0.5% -1:200000 IJ SOLN
INTRAMUSCULAR | Status: DC | PRN
  Administered 2017-03-05: 15 mL

## 2017-03-05 SURGICAL SUPPLY — 59 items
APPLIER CLIP 9.375 MED OPEN (MISCELLANEOUS) ×2
BENZOIN TINCTURE PRP APPL 2/3 (GAUZE/BANDAGES/DRESSINGS) IMPLANT
BINDER BREAST LRG (GAUZE/BANDAGES/DRESSINGS) IMPLANT
BINDER BREAST MEDIUM (GAUZE/BANDAGES/DRESSINGS) ×2 IMPLANT
BINDER BREAST XLRG (GAUZE/BANDAGES/DRESSINGS) IMPLANT
BINDER BREAST XXLRG (GAUZE/BANDAGES/DRESSINGS) IMPLANT
BLADE HEX COATED 2.75 (ELECTRODE) ×2 IMPLANT
BLADE SURG 10 STRL SS (BLADE) IMPLANT
BLADE SURG 15 STRL LF DISP TIS (BLADE) ×2 IMPLANT
BLADE SURG 15 STRL SS (BLADE) ×2
CANISTER SUC SOCK COL 7IN (MISCELLANEOUS) IMPLANT
CANISTER SUCT 1200ML W/VALVE (MISCELLANEOUS) ×2 IMPLANT
CHLORAPREP W/TINT 26ML (MISCELLANEOUS) ×2 IMPLANT
CLIP APPLIE 9.375 MED OPEN (MISCELLANEOUS) ×1 IMPLANT
COVER BACK TABLE 60X90IN (DRAPES) ×2 IMPLANT
COVER MAYO STAND STRL (DRAPES) ×2 IMPLANT
COVER PROBE W GEL 5X96 (DRAPES) ×2 IMPLANT
DECANTER SPIKE VIAL GLASS SM (MISCELLANEOUS) IMPLANT
DERMABOND ADVANCED (GAUZE/BANDAGES/DRESSINGS) ×1
DERMABOND ADVANCED .7 DNX12 (GAUZE/BANDAGES/DRESSINGS) ×1 IMPLANT
DEVICE DUBIN W/COMP PLATE 8390 (MISCELLANEOUS) ×4 IMPLANT
DRAPE LAPAROSCOPIC ABDOMINAL (DRAPES) ×2 IMPLANT
DRAPE UTILITY XL STRL (DRAPES) ×2 IMPLANT
DRSG PAD ABDOMINAL 8X10 ST (GAUZE/BANDAGES/DRESSINGS) ×4 IMPLANT
ELECT REM PT RETURN 9FT ADLT (ELECTROSURGICAL) ×2
ELECTRODE REM PT RTRN 9FT ADLT (ELECTROSURGICAL) ×1 IMPLANT
GAUZE SPONGE 4X4 12PLY STRL (GAUZE/BANDAGES/DRESSINGS) ×2 IMPLANT
GLOVE BIO SURGEON STRL SZ7 (GLOVE) ×6 IMPLANT
GLOVE EUDERMIC 7 POWDERFREE (GLOVE) ×4 IMPLANT
GOWN STRL REUS W/ TWL LRG LVL3 (GOWN DISPOSABLE) ×2 IMPLANT
GOWN STRL REUS W/ TWL XL LVL3 (GOWN DISPOSABLE) ×2 IMPLANT
GOWN STRL REUS W/TWL LRG LVL3 (GOWN DISPOSABLE) ×2
GOWN STRL REUS W/TWL XL LVL3 (GOWN DISPOSABLE) ×2
ILLUMINATOR WAVEGUIDE N/F (MISCELLANEOUS) IMPLANT
KIT MARKER MARGIN INK (KITS) ×4 IMPLANT
LIGHT WAVEGUIDE WIDE FLAT (MISCELLANEOUS) IMPLANT
NDL SAFETY ECLIPSE 18X1.5 (NEEDLE) ×1 IMPLANT
NEEDLE HYPO 18GX1.5 SHARP (NEEDLE) ×1
NEEDLE HYPO 25X1 1.5 SAFETY (NEEDLE) ×4 IMPLANT
NS IRRIG 1000ML POUR BTL (IV SOLUTION) ×2 IMPLANT
PACK BASIN DAY SURGERY FS (CUSTOM PROCEDURE TRAY) ×2 IMPLANT
PENCIL BUTTON HOLSTER BLD 10FT (ELECTRODE) ×2 IMPLANT
SHEET MEDIUM DRAPE 40X70 STRL (DRAPES) ×2 IMPLANT
SLEEVE SCD COMPRESS KNEE MED (MISCELLANEOUS) ×2 IMPLANT
SPONGE LAP 18X18 X RAY DECT (DISPOSABLE) ×2 IMPLANT
SPONGE LAP 4X18 X RAY DECT (DISPOSABLE) ×6 IMPLANT
STRIP CLOSURE SKIN 1/2X4 (GAUZE/BANDAGES/DRESSINGS) IMPLANT
SUT MNCRL AB 4-0 PS2 18 (SUTURE) ×6 IMPLANT
SUT SILK 2 0 SH (SUTURE) ×4 IMPLANT
SUT VIC AB 2-0 CT1 27 (SUTURE)
SUT VIC AB 2-0 CT1 TAPERPNT 27 (SUTURE) IMPLANT
SUT VIC AB 3-0 SH 27 (SUTURE)
SUT VIC AB 3-0 SH 27X BRD (SUTURE) IMPLANT
SUT VICRYL 3-0 CR8 SH (SUTURE) ×4 IMPLANT
SYR 10ML LL (SYRINGE) ×4 IMPLANT
TOWEL OR 17X24 6PK STRL BLUE (TOWEL DISPOSABLE) ×2 IMPLANT
TOWEL OR NON WOVEN STRL DISP B (DISPOSABLE) ×2 IMPLANT
TUBE CONNECTING 20X1/4 (TUBING) ×2 IMPLANT
YANKAUER SUCT BULB TIP NO VENT (SUCTIONS) ×4 IMPLANT

## 2017-03-05 NOTE — Op Note (Signed)
Patient Name:           Jessica Ingram   Date of Surgery:        03/05/2017  Pre op Diagnosis:      Invasive duct carcinoma right breast                                      Abnormal mammogram left breast with discordant biopsy  Post op Diagnosis:    Same  Procedure:                 Left breast lumpectomy with radioactive seed localization                                      Inject blue dye right breast                                      Right breast lumpectomy with radioactive seed localization                                      Right axillary deep sentinel lymph node biopsy  Surgeon:                     Edsel Petrin. Dalbert Batman, M.D., FACS  Assistant:                      OR staff   Indication for Assistant: n/a  Operative Indications:   his is a 66 year old woman who was recently evaluated for right breast cancer and left breast abnormal mammogram. She is here to discuss her workup findings and schedule definitive surgery. Her PCP is Marda Stalker. Dr. Lisbeth Renshaw and Dr. Jana Hakim are involved in her care. I know her because our children went to [employer] school together.     Last mammogram was 3 years ago. Recent screening mammograms showed bilateral density.  On the right side she had a 1.1 cm mass in the upper outer quadrant 11 o'clock position. Biopsy showed invasive ductal carcinoma, ER/PR strongly positive, HER-2/neu negative, Ki-67 5%.  On the left side there was a small density in the upper inner quadrant at 11:00. Breasts were very dense. Image guided biopsy of the left breast upper inner quadrant showed a 9 mm solid mass no biopsy was fibrocystic changes. The radiologist felt this was discordant and requested that this area be excised. Her right axilla looks normal on ultrasound. Because her breasts are extremely dense and she had multiple abnormalities, MRI was felt to be appropriate and this showed a third area in the left breast, lower outer quadrant which was  biopsied and completely benign.     She would like breast conservation surgery. I think she is an excellent candidate for that She'll be scheduled for right breast lumpectomy with radioactive seed localization, right axillary sentinel lymph node biopsy, and left breast lumpectomy with radioactive seed localization in the upper inner quadrant. I discussed the indications, details, techniques, and numerous risk of the surgery with her and her sister. d. She agrees with this plan. She knows that she will need radiation therapy on the right side Decisions regarding  chemotherapy will wait until the final pathology is in   Operative Findings:       On the left side the specimen mammogram looked good.  The specimen contained the seed which was supposed to be very accurately placed within the lesion as well as the biopsy clip which had migrated superiorly somewhat.  The broad posterior margin of the specimen is the pectoralis muscle.     On the right side the seed in the clip were close to each other.  The anterior margin is the skin and the posterior margin is the muscle.  Her breast are very small and thin necessitating resection all the way to the muscle posteriorly as well as the skin anteriorly.  I found four sentinel lymph nodes.  Procedure in Detail:          The patient underwent pectoral block preop.  She underwent injection of technetium radionuclide into the right breast preop by the nuclear medicine technician.  She was taken to the operating room and underwent general anesthesia.  Timeout was performed.  Intravenous antibiotics were given.  Following alcohol prep I injected 5 mL of dilute methylene blue into the right breast and massaged the breast for a few minutes.     We then prepped and draped both breast and the entire chest wall from the mid neck down to the upper abdomen.  0.5% Marcaine with epinephrine was used as local infiltration anesthetic for the skin and subcutaneous tissue     I  first performed a lumpectomy on the left side.  I the radioactive signal in the superior breast.  Curvilinear incision was made with a knife.  The lumpectomy was performed with electrocautery using the neoprobe frequently.  The specimen was removed and marked with silk sutures and a 6 color ink kit to orient the pathologist.  The specimen mammogram looked good.  The specimen was marked and sent to the lab.  Hemostasis was excellent and achieved with electrocautery.  The wound was irrigated.  The left lumpectomy cavity is marked with 5 metal clips according to our protocol.  The breast tissues were reapproximated in 2 layers with interrupted sutures of 3-0 Vicryl and the skin closed with running subcuticular 4-0 Monocryl and Dermabond.      I then performed the right-sided surgery.  I found the radioactive signal from the radioactive seed at the 12:00 position.  A transverse curvilinear incision was made with a knife.  The lumpectomy was performed with electrocautery, using the neoprobe frequently.  The specimen was removed and marked with sutures and ink.  The specimen mammogram looked good as described above.  The specimen was marked and sent to the lab.  Hemostasis was excellent.  The wound was irrigated.  5 metal clips were placed in the lumpectomy cavity.  The tissues were reapproximated.  On the right side I had to do a little bit of tissue transfer from superior and inferior but it closed quite nicely.  Multiple layers of 3-0 Vicryl and a running subcuticular 4-0 Monocryl for the skin.  Dermabond was placed.    A transverse incision was made in the right axilla at the hairline.  Dissection was carried down through the clavipectoral fascia entering the axillary space.  I found  4 sentinel lymph nodes that were very hot.  A little bit of blue dye present.  These were sent separately.  The wound was irrigated.  Hemostasis was excellent.  The clavipectoral fascia was closed with 3-0 Vicryl sutures  and skin  closed with a running subcuticular 4-0 Monocryl and Dermabond.  Clean bandages and a breast binder were placed.  The patient tolerated the procedure well was taken to PACU in stable condition.  EBL 35-45 mL.  Counts correct.  Complications none.     Edsel Petrin. Dalbert Batman, M.D., FACS General and Minimally Invasive Surgery Breast and Colorectal Surgery  Addendum: I logged onto the [facility] website and reviewed her prescription medication history  03/05/2017 11:06 AM

## 2017-03-05 NOTE — Anesthesia Postprocedure Evaluation (Signed)
Anesthesia Post Note  Patient: Jessica Ingram  Procedure(s) Performed: Procedure(s) (LRB): BILATERAL BREAST LUMPECTOMY WITH RADIOACTIVE SEED AND RIGHT AXILLARY SENTINEL LYMPH NODE BIOPSY (Bilateral)  Patient location during evaluation: PACU Anesthesia Type: General and Regional Level of consciousness: awake and alert Pain management: pain level controlled Vital Signs Assessment: post-procedure vital signs reviewed and stable Respiratory status: spontaneous breathing, nonlabored ventilation, respiratory function stable and patient connected to nasal cannula oxygen Cardiovascular status: blood pressure returned to baseline and stable Postop Assessment: no signs of nausea or vomiting Anesthetic complications: no       Last Vitals:  Vitals:   03/05/17 1145 03/05/17 1300  BP: (!) 144/75   Pulse: 77 96  Resp: 16 16  Temp:  36.5 C    Last Pain:  Vitals:   03/05/17 1245  TempSrc:   PainSc: 4                  Montez Hageman

## 2017-03-05 NOTE — Progress Notes (Signed)
Assisted Dr. Carignan with right, ultrasound guided, pectoralis block. Side rails up, monitors on throughout procedure. See vital signs in flow sheet. Tolerated Procedure well. 

## 2017-03-05 NOTE — Discharge Instructions (Signed)
[facility] °Office Phone Number [phone] ° °BREAST BIOPSY/ PARTIAL MASTECTOMY: POST OP INSTRUCTIONS ° °Always review your discharge instruction sheet given to you by the facility where your surgery was performed. ° °IF YOU HAVE DISABILITY OR FAMILY LEAVE FORMS, YOU MUST BRING THEM TO THE OFFICE FOR PROCESSING.  DO NOT GIVE THEM TO YOUR DOCTOR. ° °1. A prescription for pain medication may be given to you upon discharge.  Take your pain medication as prescribed, if needed.  If narcotic pain medicine is not needed, then you may take acetaminophen (Tylenol) or ibuprofen (Advil) as needed. °2. Take your usually prescribed medications unless otherwise directed °3. If you need a refill on your pain medication, please contact your pharmacy.  They will contact our office to request authorization.  Prescriptions will not be filled after 5pm or on week-ends. °4. You should eat very light the first 24 hours after surgery, such as soup, crackers, pudding, etc.  Resume your normal diet the day after surgery. °5. Most patients will experience some swelling and bruising in the breast.  Ice packs and a good support bra will help.  Swelling and bruising can take several days to resolve.  °6. It is common to experience some constipation if taking pain medication after surgery.  Increasing fluid intake and taking a stool softener will usually help or prevent this problem from occurring.  A mild laxative (Milk of Magnesia or Miralax) should be taken according to package directions if there are no bowel movements after 48 hours. °7. Unless discharge instructions indicate otherwise, you may remove your bandages 24-48 hours after surgery, and you may shower at that time.  You may have steri-strips (small skin tapes) in place directly over the incision.  These strips should be left on the skin for 7-10 days.  If your surgeon used skin glue on the incision, you may shower in 24 hours.  The glue will flake off over the  next 2-3 weeks.  Any sutures or staples will be removed at the office during your follow-up visit. °8. ACTIVITIES:  You may resume regular daily activities (gradually increasing) beginning the next day.  Wearing a good support bra or sports bra minimizes pain and swelling.  You may have sexual intercourse when it is comfortable. °a. You may drive when you no longer are taking prescription pain medication, you can comfortably wear a seatbelt, and you can safely maneuver your car and apply brakes. °b. RETURN TO WORK:  ______________________________________________________________________________________ °9. You should see your doctor in the office for a follow-up appointment approximately two weeks after your surgery.  Your doctor’s nurse will typically make your follow-up appointment when she calls you with your pathology report.  Expect your pathology report 2-3 business days after your surgery.  You may call to check if you do not hear from us after three days. °10. OTHER INSTRUCTIONS: _______________________________________________________________________________________________ _____________________________________________________________________________________________________________________________________ °_____________________________________________________________________________________________________________________________________ °_____________________________________________________________________________________________________________________________________ ° °WHEN TO CALL YOUR DOCTOR: °1. Fever over 101.0 °2. Nausea and/or vomiting. °3. Extreme swelling or bruising. °4. Continued bleeding from incision. °5. Increased pain, redness, or drainage from the incision. ° °The clinic staff is available to answer your questions during regular business hours.  Please don’t hesitate to call and ask to speak to one of the nurses for clinical concerns.  If you have a medical emergency, go to the nearest  emergency room or call 911.  A surgeon from [facility] is always on call at the hospital. ° °For further questions, please visit [email or web address]  ° ° ° ° °  Post Anesthesia Home Care Instructions ° °Activity: °Get plenty of rest for the remainder of the day. A responsible individual must stay with you for 24 hours following the procedure.  °For the next 24 hours, DO NOT: °-Drive a car °-Operate machinery °-Drink alcoholic beverages °-Take any medication unless instructed by your physician °-Make any legal decisions or sign important papers. ° °Meals: °Start with liquid foods such as gelatin or soup. Progress to regular foods as tolerated. Avoid greasy, spicy, heavy foods. If nausea and/or vomiting occur, drink only clear liquids until the nausea and/or vomiting subsides. Call your physician if vomiting continues. ° °Special Instructions/Symptoms: °Your throat may feel dry or sore from the anesthesia or the breathing tube placed in your throat during surgery. If this causes discomfort, gargle with warm salt water. The discomfort should disappear within 24 hours. ° °If you had a scopolamine patch placed behind your ear for the management of post- operative nausea and/or vomiting: ° °1. The medication in the patch is effective for 72 hours, after which it should be removed.  Wrap patch in a tissue and discard in the trash. Wash hands thoroughly with soap and water. °2. You may remove the patch earlier than 72 hours if you experience unpleasant side effects which may include dry mouth, dizziness or visual disturbances. °3. Avoid touching the patch. Wash your hands with soap and water after contact with the patch. °  ° °

## 2017-03-05 NOTE — Transfer of Care (Signed)
Immediate Anesthesia Transfer of Care Note  Patient: Jessica Ingram  Procedure(s) Performed: Procedure(s): BILATERAL BREAST LUMPECTOMY WITH RADIOACTIVE SEED AND RIGHT AXILLARY SENTINEL LYMPH NODE BIOPSY (Bilateral)  Patient Location: PACU  Anesthesia Type:GA combined with regional for post-op pain  Level of Consciousness: sedated  Airway & Oxygen Therapy: Patient Spontanous Breathing and Patient connected to face mask oxygen  Post-op Assessment: Report given to RN and Post -op Vital signs reviewed and stable  Post vital signs: Reviewed and stable  Last Vitals:  Vitals:   03/05/17 0830 03/05/17 0835  BP: 111/84   Pulse: 64 68  Resp: 12 17  Temp:      Last Pain:  Vitals:   03/05/17 0745  TempSrc: Oral         Complications: No apparent anesthesia complications

## 2017-03-05 NOTE — Anesthesia Procedure Notes (Signed)
Procedure Name: LMA Insertion Date/Time: 03/05/2017 9:16 AM Performed by: Toula Moos L Pre-anesthesia Checklist: Patient identified, Emergency Drugs available, Suction available, Patient being monitored and Timeout performed Patient Re-evaluated:Patient Re-evaluated prior to inductionOxygen Delivery Method: Circle system utilized Preoxygenation: Pre-oxygenation with 100% oxygen Intubation Type: IV induction Ventilation: Mask ventilation without difficulty LMA: LMA inserted LMA Size: 4.0 Number of attempts: 1 Airway Equipment and Method: Bite block Placement Confirmation: positive ETCO2 Tube secured with: Tape Dental Injury: Teeth and Oropharynx as per pre-operative assessment

## 2017-03-05 NOTE — Interval H&P Note (Signed)
History and Physical Interval Note:  03/05/2017 8:28 AM  Jessica Ingram  has presented today for surgery, with the diagnosis of RIGHT BREAST CANCER, ABNORMAL MAMMOGRAM  The various methods of treatment have been discussed with the patient and family. After consideration of risks, benefits and other options for treatment, the patient has consented to  Procedure(s): BILATERAL BREAST LUMPECTOMY WITH RADIOACTIVE SEED AND RIGHT AXILLARY SENTINEL LYMPH NODE BIOPSY (Bilateral) as a surgical intervention .  The patient's history has been reviewed, patient examined, no change in status, stable for surgery.  I have reviewed the patient's chart and labs.  Questions were answered to the patient's satisfaction.     [name]

## 2017-03-05 NOTE — Anesthesia Procedure Notes (Addendum)
Anesthesia Regional Block: Pectoralis block   Pre-Anesthetic Checklist: ,, timeout performed, Correct Patient, Correct Site, Correct Laterality, Correct Procedure, Correct Position, site marked, Risks and benefits discussed,  Surgical consent,  Pre-op evaluation,  At surgeon's request and post-op pain management  Laterality: Right  Prep: Maximum Sterile Barrier Precautions used, chloraprep       Needles:  Injection technique: Single-shot  Needle Type: Echogenic Stimulator Needle     Needle Length: 10cm      Additional Needles:   Procedures: ultrasound guided,,,,,,,,  Narrative:  Start time: 03/05/2017 8:18 AM End time: 03/05/2017 8:23 AM Injection made incrementally with aspirations every 5 mL.  Performed by: Personally  Anesthesiologist: Montez Hageman  Additional Notes: Risks, benefits and alternative to block explained extensively.  Patient tolerated procedure well, without complications.

## 2017-03-05 NOTE — Anesthesia Preprocedure Evaluation (Signed)
Anesthesia Evaluation  Patient identified by MRN, date of birth, ID band Patient awake    Reviewed: Allergy & Precautions, NPO status , Patient's Chart, lab work & pertinent test results  Airway Mallampati: II  TM Distance: >3 FB Neck ROM: Full    Dental no notable dental hx.    Pulmonary neg pulmonary ROS,    Pulmonary exam normal breath sounds clear to auscultation       Cardiovascular negative cardio ROS Normal cardiovascular exam Rhythm:Regular Rate:Normal     Neuro/Psych negative neurological ROS  negative psych ROS   GI/Hepatic negative GI ROS, Neg liver ROS,   Endo/Other  diabetes, Gestational  Renal/GU negative Renal ROS  negative genitourinary   Musculoskeletal negative musculoskeletal ROS (+)   Abdominal   Peds negative pediatric ROS (+)  Hematology negative hematology ROS (+)   Anesthesia Other Findings   Reproductive/Obstetrics negative OB ROS                             Anesthesia Physical Anesthesia Plan  ASA: II  Anesthesia Plan: General   Post-op Pain Management:  Regional for Post-op pain   Induction: Intravenous  Airway Management Planned: LMA and Oral ETT  Additional Equipment:   Intra-op Plan:   Post-operative Plan: Extubation in OR  Informed Consent: I have reviewed the patients History and Physical, chart, labs and discussed the procedure including the risks, benefits and alternatives for the proposed anesthesia with the patient or authorized representative who has indicated his/her understanding and acceptance.   Dental advisory given  Plan Discussed with: CRNA  Anesthesia Plan Comments: (PEC block)        Anesthesia Quick Evaluation

## 2017-03-08 ENCOUNTER — Encounter (HOSPITAL_BASED_OUTPATIENT_CLINIC_OR_DEPARTMENT_OTHER): Payer: Self-pay | Admitting: General Surgery

## 2017-03-11 ENCOUNTER — Other Ambulatory Visit: Payer: Self-pay | Admitting: Oncology

## 2017-03-12 ENCOUNTER — Telehealth: Payer: Self-pay | Admitting: *Deleted

## 2017-03-12 NOTE — Telephone Encounter (Signed)
Ordered oncotype per Dr. Jana Hakim on left and right breast cancer.  Faxed requisition to pathology and confirmed receipt.

## 2017-03-15 ENCOUNTER — Other Ambulatory Visit: Payer: Self-pay | Admitting: Oncology

## 2017-03-15 NOTE — Progress Notes (Unsigned)
[place]  [phone] [phone]     ID: VERLAINE EMBRY DOB: December 04, 1950  MR#: [id number]  MPN#:[id number]  Patient Care Team: Marda Stalker, PA-C as PCP - General (Family Medicine) Fanny Skates, MD as Consulting Physician (General Surgery) Kilian Schwartz, Virgie Dad, MD as Consulting Physician (Oncology) Kyung Rudd, MD as Consulting Physician (Radiation Oncology) Juanita Craver, MD as Consulting Physician (Gastroenterology) Sydnee Levans, MD as Consulting Physician (Dermatology) Azucena Fallen, MD as Consulting Physician (Obstetrics and Gynecology) Elsie Saas, MD as Consulting Physician (Orthopedic Surgery) Chauncey Cruel, MD OTHER MD:  CHIEF COMPLAINT: Estrogen receptor positive breast cancer  CURRENT TREATMENT: Awaiting definitive surgery   BREAST CANCER HISTORY: "Gay Filler" had bilateral screening mammography with tomography at [facility] 01/01/2017. The breast density was category D. In the right breast superiorly there was a 1.1 cm irregular mass. In the left breast there was an area of possible architectural distortion.  The patient was recalled 01/07/2017 or bilateral diagnostic mammography and bilateral ultrasonography. In the right breast superiorly there was a 1.5 cm irregular mass which was located by sonography in the upper inner quadrant area this measured 1.1 cm.  In the left breast the area of architectural distortion was again noted. By ultrasound this was an irregular mass with indistinct margins in the upper inner quadrant of the left breast.  Biopsy of both the areas in question was performed 01/11/2017. 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 The left breast biopsy showed only fibrocystic changes  but this was felt to be discordant by radiology  The patient's subsequent history is as detailed below  INTERVAL HISTORY: Gay Filler was evaluated in the multidisciplinary breast cancer conference 01/20/2017 accompanied by her sister Rod Holler. Her case was also presented in the multidisciplinary breast cancer conference that same morning. At that time a preliminary plan was proposed: On the right side, lumpectomy with sentinel lymph node biopsy; on the left side it was felt breast MRI would be helpful. The patient will benefit from radiation and anti-estrogens and is a candidate for genetics  REVIEW OF SYSTEMS: There were no specific symptoms leading to the original mammogram, which was routinely scheduled. The patient denies unusual headaches, visual changes, nausea, vomiting, stiff neck, dizziness, or gait imbalance. There has been no cough, phlegm production, or pleurisy, no chest pain or pressure, and no change in bowel or bladder habits. The patient denies fever, rash, bleeding, unexplained fatigue or unexplained weight loss. A detailed review of systems was otherwise entirely negative.  PAST MEDICAL HISTORY: Past Medical History:  Diagnosis Date  . Arthritis   . Genetic testing 02/19/2017   Ms. Billing underwent genetic counseling and testing for hereditary cancer syndromes on 02/02/2017. Her results were negative for mutations in all 46 genes analyzed by [facility]'s 46-gene Common Hereditary Cancers Panel. Genes analyzed include: APC, ATM, AXIN2, BARD1, BMPR1A, BRCA1, BRCA2, BRIP1, CDH1, CDKN2A, CHEK2, CTNNA1, DICER1, EPCAM, GREM1, HOXB13, KIT, MEN1, MLH1, MSH2, MSH3, MSH6, MUTYH, NBN,  . Gestational diabetes   . Hepatitis    at age 69    PAST SURGICAL HISTORY: Past Surgical History:  Procedure Laterality Date  . ANTERIOR CRUCIATE LIGAMENT REPAIR Left   . BREAST LUMPECTOMY WITH RADIOACTIVE SEED AND SENTINEL LYMPH NODE BIOPSY Bilateral 03/05/2017   Procedure: BILATERAL BREAST LUMPECTOMY WITH  RADIOACTIVE SEED  AND RIGHT AXILLARY SENTINEL LYMPH NODE BIOPSY;  Surgeon: Fanny Skates, MD;  Location: [place];  Service: General;  Laterality: Bilateral;    FAMILY HISTORY Family History  Problem Relation Age of Onset  . Lung cancer Mother 27       d.93 history of smoking  . Prostate cancer Father 38       d.83 prostate cancer metastasized to bone  . Colon cancer Brother 85       d.53  . Breast cancer Maternal Aunt 73       d.85s  . Colon cancer Maternal Aunt 83  . Bone cancer Paternal Aunt        d.85  . Prostate cancer Paternal Uncle 51       d.72s metastasized to bladder  . Kidney cancer Maternal Grandfather 80       d.53 possible colon cancer  . Lung cancer Paternal Uncle 94       d.60  . Brain cancer Cousin 59       d.11 paternal first-cousin. Son of uncle with prostate cancer.  . Prostate cancer Paternal Grandfather        d.89  The patient's father was diagnosed with prostate cancer at the age of 24. He died from unrelated causes at age 75. The patient's mother was diagnosed with lung cancer at age 79. She was a smoker. She died from unrelated causes at age 70. The patient has one brother, 5 sisters. The brother was diagnosed with colon cancer at age 52 and died at age 50. There is also a maternal aunt diagnosed with both breast and colon cancers in her 6s. There is no history of ovarian cancer in the family  GYNECOLOGIC HISTORY:  No LMP recorded. Patient is postmenopausal. Menarche age 89, first live birth age 82, which increases the risk of breast cancer developing. The patient is GX P2. She went through the change of life age 46 and was on hormone replacement until age 60. She also took oral contraceptives remotely for about 12 years with no complications  SOCIAL HISTORY:  Gay Filler is widowed, her husband dying from complications of depression in September 2017. 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 The patient has no grandchildren. She is not a church attender     ADVANCED DIRECTIVES:  in place; the patient has named her sister Bevely Palmer as healthcare power of attorney. Rod Holler may be reached at [phone]    HEALTH MAINTENANCE: Social History  Substance Use Topics  . Smoking status: Never Smoker  . Smokeless tobacco: Never Used  . Alcohol use Yes     Comment: 7     Colonoscopy: January 2017/man  PAP: 2014  Bone density: 2014? Went over OB/GYN   No Known Allergies  Current Outpatient Prescriptions  Medication Sig Dispense Refill  . HYDROcodone-acetaminophen (NORCO) 5-325 MG tablet Take 1-2 tablets by mouth every 6 (six) hours as needed for moderate pain or severe pain. 30 tablet 0   No current facility-administered medications for this visit.     OBJECTIVE: Middle-aged white woman who appears well  There were no vitals filed for this visit.   There is no height or weight  on file to calculate BMI.    ECOG FS:0 - Asymptomatic  Ocular: Sclerae unicteric, pupils equal, round and reactive to light Ear-nose-throat: Oropharynx clear and moist Lymphatic: No cervical or supraclavicular adenopathy Lungs no rales or rhonchi, good excursion bilaterally Heart regular rate and rhythm, no murmur appreciated Abd soft, nontender, positive bowel sounds MSK no focal spinal tenderness, no joint edema Neuro: non-focal, well-oriented, appropriate affect Breasts: BOTH BREASTS ARE STATUS POST RECENT BIOPSY. I DO NOT PALPATE A MASS IN EITHER BREAST. THERE ARE NO SKIN OR NIPPLE CHANGES OF CONCERN BEYOND THE EXPECTED MILD ECCHYMOSES. BOTH AXILLAE ARE BENIGN.    LAB RESULTS:  CMP     Component Value Date/Time   NA 140 01/20/2017 1249   K 3.9  01/20/2017 1249   CO2 26 01/20/2017 1249   GLUCOSE 138 01/20/2017 1249   BUN 12.9 01/20/2017 1249   CREATININE 1.0 01/20/2017 1249   CALCIUM 9.9 01/20/2017 1249   PROT 7.9 01/20/2017 1249   ALBUMIN 4.7 01/20/2017 1249   AST 20 01/20/2017 1249   ALT 20 01/20/2017 1249   ALKPHOS 58 01/20/2017 1249   BILITOT 0.58 01/20/2017 1249    No results found for: TOTALPROTELP, ALBUMINELP, A1GS, A2GS, BETS, BETA2SER, GAMS, MSPIKE, SPEI  No results found for: Nils Pyle, [facility]  Lab Results  Component Value Date   WBC 4.6 01/20/2017   NEUTROABS 2.7 01/20/2017   HGB 14.1 01/20/2017   HCT 40.7 01/20/2017   MCV 95.5 01/20/2017   PLT 183 01/20/2017      Chemistry      Component Value Date/Time   NA 140 01/20/2017 1249   K 3.9 01/20/2017 1249   CO2 26 01/20/2017 1249   BUN 12.9 01/20/2017 1249   CREATININE 1.0 01/20/2017 1249      Component Value Date/Time   CALCIUM 9.9 01/20/2017 1249   ALKPHOS 58 01/20/2017 1249   AST 20 01/20/2017 1249   ALT 20 01/20/2017 1249   BILITOT 0.58 01/20/2017 1249       No results found for: LABCA2  No components found for: PPIRJJ884  No results for input(s): INR in the last 168 hours.  Urinalysis No results found for: COLORURINE, APPEARANCEUR, LABSPEC, PHURINE, GLUCOSEU, HGBUR, BILIRUBINUR, KETONESUR, PROTEINUR, UROBILINOGEN, NITRITE, LEUKOCYTESUR   STUDIES: No results found.  ELIGIBLE FOR AVAILABLE RESEARCH PROTOCOL: no  ASSESSMENT: 66 y.o. [place] woman status post right breast upper inner quadrant biopsy 01/11/2017 for a clinical  T1c N0, stage IA invasive ductal carcinoma, grade 1, estrogen and progesterone receptor positive, HER-2 nonamplified, with an MIB-1 of 5%.   (1) definitive surgery pending (right breast conserving surgery with sentinel lymph node sampling)  (a) MRI pending, with question of further evaluation left breast.  (2) Oncotype DX to be obtained from the final surgical sample  (3) adjuvant  radiation as appropriate  (4) anti-estrogens to follow at the completion of local treatment  (5) genetics testing scheduled  PLAN: We spent the better part of today's hour-long appointment discussing the biology of breast cancer in general, and the specifics of the patient's tumor in particular. We first reviewed the fact that cancer is not one disease but more than 100 different diseases and that it is important to keep them separate-- otherwise when friends and relatives discuss their own cancer experiences with Gay Filler confusion can result. Similarly we explained that if breast cancer spreads to the bone or liver, the patient would not have bone cancer or liver cancer, but breast cancer in the bone and breast  cancer in the liver: one cancer in three places-- not 3 different cancers which otherwise would have to be treated in 3 different ways.  We discussed the difference between local and systemic therapy. In terms of loco-regional treatment, lumpectomy plus radiation is equivalent to mastectomy as far as survival is concerned. For this reason, and because the cosmetic results are generally superior, we recommend breast conserving surgery. We also noted that in terms of sequencing of treatments, whether systemic therapy or surgery is done first does not affect the ultimate outcome.  We then discussed the rationale for systemic therapy. There is some risk that this cancer may have already spread to other parts of her body. Patients frequently ask at this point about bone scans, CAT scans and PET scans to find out if they have occult breast cancer somewhere else. The problem is that in early stage disease we are much more likely to find false positives then true cancers and this would expose the patient to unnecessary procedures as well as unnecessary radiation. Scans cannot answer the question the patient really would like to know, which is whether she has microscopic disease elsewhere in her body. For  those reasons we do not recommend them.  Of course we would proceed to aggressive evaluation of any symptoms that might suggest metastatic disease, but that is not the case here.  Next we went over the options for systemic therapy which are anti-estrogens, anti-HER-2 immunotherapy, and chemotherapy. [place] does not meet criteria for anti-HER-2 immunotherapy. She is a good candidate for anti-estrogens.  The question of chemotherapy is more complicated. Chemotherapy is most effective in rapidly growing, aggressive tumors. It is much less effective in low-grade, slow growing cancers, like Sally's. For that reason we are going to request an Oncotype from the definitive surgical sample, as suggested by NCCN guidelines. However, Gay Filler understands the expectation is that she will not need chemotherapy given her expected excellent prognosis  She qualifies for genetics testing in particular because of concerns regarding possible Lynch syndrome. There is no need to wait on her definitive surgery to have the final genetics results.  Accordingly the plan is to start with breast MRI to clarify the situation on the left side, proceeding with repeat left breast biopsy if necessary, otherwise with excisional biopsy of that area; but in any case right lumpectomy and sentinel lymph node sampling and Oncotype testing.  Gay Filler has a good understanding of the overall plan. She agrees with it. She knows the goal of treatment in her case is cure. She will call with any problems that may develop before her next visit here.  Chauncey Cruel, MD   03/15/2017 3:20 PM Medical Oncology and Hematology [facility] [address] [place], [place] [address] Tel. [phone]    Fax. [phone]

## 2017-03-17 ENCOUNTER — Other Ambulatory Visit: Payer: Self-pay | Admitting: General Surgery

## 2017-03-17 ENCOUNTER — Telehealth: Payer: Self-pay

## 2017-03-17 DIAGNOSIS — C50212 Malignant neoplasm of upper-inner quadrant of left female breast: Secondary | ICD-10-CM

## 2017-03-17 DIAGNOSIS — Z170 Estrogen receptor positive status [ER+]: Secondary | ICD-10-CM

## 2017-03-17 NOTE — Telephone Encounter (Signed)
Jessica Ingram from genomics for the oncotype stated that the R breast was initiated and approved. The L breast was not initiated so therefore she will be faxing paperwork for this one to be initiated. Dx Code F81.017. Procedure is 510[address].

## 2017-03-19 ENCOUNTER — Encounter (HOSPITAL_BASED_OUTPATIENT_CLINIC_OR_DEPARTMENT_OTHER): Payer: Self-pay | Admitting: *Deleted

## 2017-03-28 NOTE — H&P (Signed)
Jessica Ingram Location: [facility] Patient #: [id number] DOB: 16-Mar-1951 Widowed / Language: Cleophus Molt / Race: White Female      History of Present Illness       The patient is a 66 year old female who presents with breast cancer. This is a delightful 66 year old woman who returns for her first postop visit. Dr. Lisbeth Renshaw and Dr. Jana Hakim are involved in her care. Her PCP is Marda Stalker.      On Mar 05, 2017 she underwent left breast lumpectomy with radioactive seed localization because of a discordant biopsy. She also underwent right breast lumpectomy and right axillary sentinel lymph node biopsy because of a biopsy-proven cancer in the upper outer quadrant.      On the right side. 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 All 4 lymph nodes were negative. ER 100%, PR 95%, HER-2/neu negative, Ki-67 5%. 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 Close posterior margin which is the muscle. ER 90%. PR 0. Ki-67 5%, HER-2 negative. Preoperative genetics testing was negative.      She is doing fairly well except for some swelling in her right axilla. This turned out to be a large seroma and we aspirated that today with good result       I have discussed her case with Dr. Jana Hakim and Dr. Lisbeth Renshaw and has been presented in conference this morning Dr. Jana Hakim has requested ONCOTYPE on both cancers Consensus view is to proceed with left axillary sentinel node biopsy, as this may influence radiation therapy and chemotherapy. We had a very long discussion about the sentinel lymph node biopsy. She is willing to have this done. I told her that she may or may not need chemotherapy but that a decision about chemotherapy and a Port-A-Cath will be made later after all of the information was in.      She will be scheduled for injection of blue dye left breast, left axillary  deep sentinel lymph node biopsy. She is aware of the indications, details, techniques, and numerous risk of the surgery. She is aware of the risk of bleeding, infection, fluid collection, arm swelling, arm numbness, and even reoperative axillary surgery if she has advanced axillary disease. She understands all of these issues and all of her questions were answered. She agrees with this plan.  Orders have been placed in Epic and posting sheet completed.   Allergies Allergies Reconciled   Medication History  No Current Medications Medications Reconciled  Vitals   Weight: 139.8 lb Height: 66in Body Surface Area: 1.72 m Body Mass Index: 22.56 kg/m  Temp.: 97.67F  Pulse: 91 (Regular)  BP: 140/86 (Sitting, Left Arm, Standard)   Physical Exam  General Mental Status-Alert. General Appearance-Not in acute distress. Build & Nutrition-Well nourished. Posture-Normal posture. Gait-Normal.  Head and Neck Head-normocephalic, atraumatic with no lesions or palpable masses. Trachea-midline. Thyroid Gland Characteristics - normal size and consistency and no palpable nodules.  Chest and Lung Exam Chest and lung exam reveals -on auscultation, normal breath sounds, no adventitious sounds and normal vocal resonance.  Breast Note: Right breast reveals curvilinear lumpectomy incision at the 12:30 position. Healing uneventfully. Right axillary incision without infection but large seroma. Following alcohol prep I aspirated 110 cc of clear serous fluid with complete collapse of the cavity. On the left side and there was a healing curvilinear lumpectomy incision in the upper inner quadrant.  Healing well. No axillary mass on the left   Cardiovascular Cardiovascular examination reveals -normal heart sounds, regular rate and rhythm with no murmurs and femoral artery auscultation bilaterally reveals normal pulses, no bruits, no  thrills.  Abdomen Inspection Inspection of the abdomen reveals - No Hernias. Palpation/Percussion Palpation and Percussion of the abdomen reveal - Soft, Non Tender, No Rigidity (guarding), No hepatosplenomegaly and No Palpable abdominal masses.  Neurologic Neurologic evaluation reveals -alert and oriented x 3 with no impairment of recent or remote memory, normal attention span and ability to concentrate, normal sensation and normal coordination.  Musculoskeletal Normal Exam - Bilateral-Upper Extremity Strength Normal and Lower Extremity Strength Normal.    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  You should hear from our office's scheduling department within 5 working days about the location, date, and time of surgery. We try to make accommodations for patient's preferences in scheduling surgery, but sometimes the OR schedule or the surgeon's schedule prevents [place] from making those accommodations. If you have not heard from our office [phone]) in 5 working days, call the office and ask for your surgeon's nurse. If you have other questions about your diagnosis, plan, or surgery, call the office and ask for your surgeon's nurse.   You are recovering from your bilateral breast surgery without any signs of infection You had a seroma in the right axilla and we aspirated 110 cc today. This may or may not recur I will see you in 2 weeks to make sure the fluid does not return.  The cancer in your right breast was 1.4 cm in size in the lymph nodes are negative. No further surgery is required on the right side  On the left side, we were somewhat surprised to find that you had an invasive cancer 1.2 cm in diameter. Our consensus recommendation is that we proceed with a left axillary sentinel lymph node biopsy Dr. Jana Hakim has requested ONCOTYPE  testing on both cancers  Decisions about the extent of radiation therapy will depend on the left axillary lymph node status. Decisions about chemotherapy will depend on the lymph node status and the ONCOTYPE test.  We will proceed with scheduling of the left axillary sentinel lymph node biopsy as soon as possible.  PRIMARY CANCER OF UPPER INNER QUADRANT OF LEFT FEMALE BREAST [phone])    Edsel Petrin. Dalbert Batman, M.D., [facility] General and Minimally invasive Surgery Breast and Colorectal Surgery Office:   [phone] Pager:   [phone]

## 2017-03-29 ENCOUNTER — Encounter (HOSPITAL_BASED_OUTPATIENT_CLINIC_OR_DEPARTMENT_OTHER): Payer: Self-pay | Admitting: *Deleted

## 2017-03-29 ENCOUNTER — Ambulatory Visit (HOSPITAL_BASED_OUTPATIENT_CLINIC_OR_DEPARTMENT_OTHER): Payer: BLUE CROSS/BLUE SHIELD | Admitting: Anesthesiology

## 2017-03-29 ENCOUNTER — Ambulatory Visit (HOSPITAL_BASED_OUTPATIENT_CLINIC_OR_DEPARTMENT_OTHER)
Admission: RE | Admit: 2017-03-29 | Discharge: 2017-03-29 | Disposition: A | Discharge status: Home / Self Care | Payer: BLUE CROSS/BLUE SHIELD | Source: Ambulatory Visit | Attending: General Surgery | Admitting: General Surgery

## 2017-03-29 ENCOUNTER — Ambulatory Visit (HOSPITAL_COMMUNITY)
Admission: RE | Admit: 2017-03-29 | Discharge: 2017-03-29 | Disposition: A | Discharge status: Home / Self Care | Payer: BLUE CROSS/BLUE SHIELD | Source: Ambulatory Visit | Attending: General Surgery | Admitting: General Surgery

## 2017-03-29 ENCOUNTER — Encounter (HOSPITAL_BASED_OUTPATIENT_CLINIC_OR_DEPARTMENT_OTHER)
Admission: RE | Disposition: A | Discharge status: Home / Self Care | Payer: Self-pay | Source: Ambulatory Visit | Attending: General Surgery

## 2017-03-29 DIAGNOSIS — C50212 Malignant neoplasm of upper-inner quadrant of left female breast: Secondary | ICD-10-CM | POA: Diagnosis not present

## 2017-03-29 DIAGNOSIS — Z170 Estrogen receptor positive status [ER+]: Secondary | ICD-10-CM

## 2017-03-29 DIAGNOSIS — C50211 Malignant neoplasm of upper-inner quadrant of right female breast: Secondary | ICD-10-CM

## 2017-03-29 DIAGNOSIS — N6489 Other specified disorders of breast: Secondary | ICD-10-CM | POA: Diagnosis not present

## 2017-03-29 DIAGNOSIS — M1990 Unspecified osteoarthritis, unspecified site: Secondary | ICD-10-CM | POA: Insufficient documentation

## 2017-03-29 DIAGNOSIS — C50411 Malignant neoplasm of upper-outer quadrant of right female breast: Secondary | ICD-10-CM | POA: Diagnosis present

## 2017-03-29 HISTORY — PX: AXILLARY SENTINEL NODE BIOPSY: SHX5738

## 2017-03-29 HISTORY — PX: FINE NEEDLE ASPIRATION: SHX6590

## 2017-03-29 SURGERY — FINE NEEDLE ASPIRATION
Anesthesia: General | Site: Axilla | Laterality: Right

## 2017-03-29 MED ORDER — CELECOXIB 400 MG PO CAPS
400.0000 mg | ORAL_CAPSULE | ORAL | Status: AC
  Administered 2017-03-29: 400 mg via ORAL

## 2017-03-29 MED ORDER — EPHEDRINE SULFATE-NACL 50-0.9 MG/10ML-% IV SOSY
PREFILLED_SYRINGE | INTRAVENOUS | Status: DC | PRN
Start: 2017-03-29 — End: 2017-03-29
  Administered 2017-03-29: 10 mg via INTRAVENOUS

## 2017-03-29 MED ORDER — PROPOFOL 10 MG/ML IV BOLUS
INTRAVENOUS | Status: DC | PRN
  Administered 2017-03-29: 150 mg via INTRAVENOUS

## 2017-03-29 MED ORDER — GABAPENTIN 300 MG PO CAPS
ORAL_CAPSULE | ORAL | Status: AC
  Filled 2017-03-29: qty 1

## 2017-03-29 MED ORDER — SODIUM CHLORIDE 0.9 % IJ SOLN
INTRAMUSCULAR | Status: DC | PRN
  Administered 2017-03-29: 5 mL via SUBCUTANEOUS

## 2017-03-29 MED ORDER — ACETAMINOPHEN 500 MG PO TABS
ORAL_TABLET | ORAL | Status: AC
  Filled 2017-03-29: qty 2

## 2017-03-29 MED ORDER — METHYLENE BLUE 0.5 % INJ SOLN
INTRAVENOUS | Status: AC
  Filled 2017-03-29: qty 10

## 2017-03-29 MED ORDER — FENTANYL CITRATE (PF) 100 MCG/2ML IJ SOLN
INTRAMUSCULAR | Status: AC
  Filled 2017-03-29: qty 2

## 2017-03-29 MED ORDER — PROMETHAZINE HCL 25 MG/ML IJ SOLN: 6.2500 mg | INTRAMUSCULAR | Status: DC | PRN

## 2017-03-29 MED ORDER — MIDAZOLAM HCL 2 MG/2ML IJ SOLN
1.0000 mg | INTRAMUSCULAR | Status: DC | PRN
  Administered 2017-03-29: 2 mg via INTRAVENOUS

## 2017-03-29 MED ORDER — SODIUM CHLORIDE 0.9 % IJ SOLN
INTRAMUSCULAR | Status: AC
  Filled 2017-03-29: qty 10

## 2017-03-29 MED ORDER — CEFAZOLIN SODIUM-DEXTROSE 2-4 GM/100ML-% IV SOLN
2.0000 g | INTRAVENOUS | Status: AC
  Administered 2017-03-29: 2 g via INTRAVENOUS

## 2017-03-29 MED ORDER — CELECOXIB 200 MG PO CAPS
ORAL_CAPSULE | ORAL | Status: AC
  Filled 2017-03-29: qty 2

## 2017-03-29 MED ORDER — ACETAMINOPHEN 650 MG RE SUPP: 650.0000 mg | RECTAL | Status: DC | PRN

## 2017-03-29 MED ORDER — SODIUM CHLORIDE 0.9% FLUSH: 3.0000 mL | Freq: Two times a day (BID) | INTRAVENOUS | Status: DC

## 2017-03-29 MED ORDER — TECHNETIUM TC 99M SULFUR COLLOID FILTERED
1.0000 | Freq: Once | INTRAVENOUS | Status: AC | PRN
  Administered 2017-03-29: 1 via INTRADERMAL

## 2017-03-29 MED ORDER — SCOPOLAMINE 1 MG/3DAYS TD PT72: 1.0000 | MEDICATED_PATCH | Freq: Once | TRANSDERMAL | Status: DC | PRN

## 2017-03-29 MED ORDER — CHLORHEXIDINE GLUCONATE CLOTH 2 % EX PADS: 6.0000 | MEDICATED_PAD | Freq: Once | CUTANEOUS | Status: DC

## 2017-03-29 MED ORDER — 0.9 % SODIUM CHLORIDE (POUR BTL) OPTIME
TOPICAL | Status: DC | PRN
  Administered 2017-03-29: 300 mL

## 2017-03-29 MED ORDER — DEXAMETHASONE SODIUM PHOSPHATE 4 MG/ML IJ SOLN
INTRAMUSCULAR | Status: DC | PRN
  Administered 2017-03-29: 10 mg via INTRAVENOUS

## 2017-03-29 MED ORDER — MIDAZOLAM HCL 2 MG/2ML IJ SOLN
INTRAMUSCULAR | Status: AC
  Filled 2017-03-29: qty 2

## 2017-03-29 MED ORDER — CEFAZOLIN SODIUM-DEXTROSE 2-4 GM/100ML-% IV SOLN
INTRAVENOUS | Status: AC
  Filled 2017-03-29: qty 100

## 2017-03-29 MED ORDER — LIDOCAINE 2% (20 MG/ML) 5 ML SYRINGE
INTRAMUSCULAR | Status: DC | PRN
  Administered 2017-03-29: 80 mg via INTRAVENOUS

## 2017-03-29 MED ORDER — LACTATED RINGERS IV SOLN: INTRAVENOUS | Status: DC

## 2017-03-29 MED ORDER — ACETAMINOPHEN 500 MG PO TABS
1000.0000 mg | ORAL_TABLET | ORAL | Status: AC
  Administered 2017-03-29: 1000 mg via ORAL

## 2017-03-29 MED ORDER — HYDROCODONE-ACETAMINOPHEN 5-325 MG PO TABS: 1.0000 | ORAL_TABLET | Freq: Four times a day (QID) | ORAL | 0 refills | Status: DC | PRN

## 2017-03-29 MED ORDER — OXYCODONE HCL 5 MG PO TABS: 5.0000 mg | ORAL_TABLET | ORAL | Status: DC | PRN

## 2017-03-29 MED ORDER — BUPIVACAINE-EPINEPHRINE 0.5% -1:200000 IJ SOLN
INTRAMUSCULAR | Status: DC | PRN
  Administered 2017-03-29: 3 mL

## 2017-03-29 MED ORDER — SODIUM CHLORIDE 0.9 % IV SOLN: 250.0000 mL | INTRAVENOUS | Status: DC | PRN

## 2017-03-29 MED ORDER — ACETAMINOPHEN 325 MG PO TABS: 650.0000 mg | ORAL_TABLET | ORAL | Status: DC | PRN

## 2017-03-29 MED ORDER — LACTATED RINGERS IV SOLN
INTRAVENOUS | Status: DC
  Administered 2017-03-29: 12:00:00 via INTRAVENOUS

## 2017-03-29 MED ORDER — FENTANYL CITRATE (PF) 100 MCG/2ML IJ SOLN
50.0000 ug | INTRAMUSCULAR | Status: DC | PRN
  Administered 2017-03-29: 50 ug via INTRAVENOUS

## 2017-03-29 MED ORDER — BUPIVACAINE-EPINEPHRINE (PF) 0.5% -1:200000 IJ SOLN
INTRAMUSCULAR | Status: AC
  Filled 2017-03-29: qty 30

## 2017-03-29 MED ORDER — FENTANYL CITRATE (PF) 100 MCG/2ML IJ SOLN: 25.0000 ug | INTRAMUSCULAR | Status: DC | PRN

## 2017-03-29 MED ORDER — GABAPENTIN 300 MG PO CAPS
300.0000 mg | ORAL_CAPSULE | ORAL | Status: AC
  Administered 2017-03-29: 300 mg via ORAL

## 2017-03-29 MED ORDER — SODIUM CHLORIDE 0.9% FLUSH: 3.0000 mL | INTRAVENOUS | Status: DC | PRN

## 2017-03-29 SURGICAL SUPPLY — 62 items
APPLIER CLIP 11 MED OPEN (CLIP)
BANDAGE ACE 6X5 VEL STRL LF (GAUZE/BANDAGES/DRESSINGS) IMPLANT
BENZOIN TINCTURE PRP APPL 2/3 (GAUZE/BANDAGES/DRESSINGS) IMPLANT
BLADE HEX COATED 2.75 (ELECTRODE) ×4 IMPLANT
BLADE SURG 10 STRL SS (BLADE) ×4 IMPLANT
BLADE SURG 15 STRL LF DISP TIS (BLADE) ×4 IMPLANT
BLADE SURG 15 STRL SS (BLADE) ×4
CANISTER SUCT 1200ML W/VALVE (MISCELLANEOUS) ×4 IMPLANT
CHLORAPREP W/TINT 26ML (MISCELLANEOUS) ×4 IMPLANT
CLIP APPLIE 11 MED OPEN (CLIP) IMPLANT
CLIP TI MEDIUM 6 (CLIP) ×4 IMPLANT
CLOSURE WOUND 1/2 X4 (GAUZE/BANDAGES/DRESSINGS)
COVER BACK TABLE 60X90IN (DRAPES) ×4 IMPLANT
COVER MAYO STAND STRL (DRAPES) ×4 IMPLANT
COVER PROBE W GEL 5X96 (DRAPES) ×4 IMPLANT
DECANTER SPIKE VIAL GLASS SM (MISCELLANEOUS) IMPLANT
DERMABOND ADVANCED (GAUZE/BANDAGES/DRESSINGS)
DERMABOND ADVANCED .7 DNX12 (GAUZE/BANDAGES/DRESSINGS) IMPLANT
DRAIN CHANNEL 19F RND (DRAIN) IMPLANT
DRAIN HEMOVAC 1/8 X 5 (WOUND CARE) IMPLANT
DRAPE LAPAROSCOPIC ABDOMINAL (DRAPES) ×4 IMPLANT
DRAPE UTILITY XL STRL (DRAPES) ×4 IMPLANT
DRSG PAD ABDOMINAL 8X10 ST (GAUZE/BANDAGES/DRESSINGS) IMPLANT
ELECT REM PT RETURN 9FT ADLT (ELECTROSURGICAL) ×4
ELECTRODE REM PT RTRN 9FT ADLT (ELECTROSURGICAL) ×2 IMPLANT
EVACUATOR SILICONE 100CC (DRAIN) IMPLANT
GAUZE SPONGE 4X4 12PLY STRL (GAUZE/BANDAGES/DRESSINGS) ×4 IMPLANT
GAUZE SPONGE 4X4 12PLY STRL LF (GAUZE/BANDAGES/DRESSINGS) IMPLANT
GAUZE SPONGE 4X4 16PLY XRAY LF (GAUZE/BANDAGES/DRESSINGS) IMPLANT
GLOVE EUDERMIC 7 POWDERFREE (GLOVE) ×4 IMPLANT
GOWN STRL REUS W/ TWL LRG LVL3 (GOWN DISPOSABLE) ×2 IMPLANT
GOWN STRL REUS W/ TWL XL LVL3 (GOWN DISPOSABLE) ×2 IMPLANT
GOWN STRL REUS W/TWL LRG LVL3 (GOWN DISPOSABLE) ×2
GOWN STRL REUS W/TWL XL LVL3 (GOWN DISPOSABLE) ×2
NDL SAFETY ECLIPSE 18X1.5 (NEEDLE) ×2 IMPLANT
NEEDLE HYPO 18GX1.5 SHARP (NEEDLE) ×2
NEEDLE HYPO 25X1 1.5 SAFETY (NEEDLE) ×8 IMPLANT
NS IRRIG 1000ML POUR BTL (IV SOLUTION) ×4 IMPLANT
PACK BASIN DAY SURGERY FS (CUSTOM PROCEDURE TRAY) ×4 IMPLANT
PAD ALCOHOL SWAB (MISCELLANEOUS) ×4 IMPLANT
PENCIL BUTTON HOLSTER BLD 10FT (ELECTRODE) ×4 IMPLANT
PIN SAFETY STERILE (MISCELLANEOUS) IMPLANT
SHEET MEDIUM DRAPE 40X70 STRL (DRAPES) ×4 IMPLANT
SLEEVE SCD COMPRESS KNEE MED (MISCELLANEOUS) IMPLANT
SPONGE LAP 18X18 X RAY DECT (DISPOSABLE) IMPLANT
SPONGE LAP 4X18 X RAY DECT (DISPOSABLE) ×4 IMPLANT
STAPLER VISISTAT 35W (STAPLE) ×4 IMPLANT
STRIP CLOSURE SKIN 1/2X4 (GAUZE/BANDAGES/DRESSINGS) IMPLANT
SUT ETHILON 3 0 FSL (SUTURE) IMPLANT
SUT MNCRL AB 4-0 PS2 18 (SUTURE) ×8 IMPLANT
SUT SILK 2 0 SH (SUTURE) ×4 IMPLANT
SUT VIC AB 2-0 CT1 27 (SUTURE)
SUT VIC AB 2-0 CT1 TAPERPNT 27 (SUTURE) IMPLANT
SUT VIC AB 3-0 SH 27 (SUTURE)
SUT VIC AB 3-0 SH 27X BRD (SUTURE) IMPLANT
SUT VICRYL 3-0 CR8 SH (SUTURE) IMPLANT
SYR 10ML LL (SYRINGE) ×8 IMPLANT
TOWEL OR 17X24 6PK STRL BLUE (TOWEL DISPOSABLE) ×8 IMPLANT
TOWEL OR NON WOVEN STRL DISP B (DISPOSABLE) ×4 IMPLANT
TUBE CONNECTING 20'X1/4 (TUBING) ×1
TUBE CONNECTING 20X1/4 (TUBING) ×3 IMPLANT
YANKAUER SUCT BULB TIP NO VENT (SUCTIONS) ×4 IMPLANT

## 2017-03-29 NOTE — Progress Notes (Signed)
Assisted Dr. Turk with left, ultrasound guided, pectoralis block. Side rails up, monitors on throughout procedure. See vital signs in flow sheet. Tolerated Procedure well. 

## 2017-03-29 NOTE — Interval H&P Note (Signed)
History and Physical Interval Note:  03/29/2017 11:45 AM  Jessica Ingram  has presented today for surgery, with the diagnosis of BILATERAL BREAST CANCER  The various methods of treatment have been discussed with the patient and family. After consideration of risks, benefits and other options for treatment, the patient has consented to  Procedure(s): LEFT AXILLARY SENTINEL NODE BIOPSY, INJECT BLUE DYE LEFT BREAST (Left) as a surgical intervention .  The patient's history has been reviewed, patient examined, no change in status, stable for surgery.  I have reviewed the patient's chart and labs.  Questions were answered to the patient's satisfaction.     [name]

## 2017-03-29 NOTE — Anesthesia Procedure Notes (Signed)
Anesthesia Regional Block: Pectoralis block   Pre-Anesthetic Checklist: ,, timeout performed, Correct Patient, Correct Site, Correct Laterality, Correct Procedure, Correct Position, site marked, Risks and benefits discussed,  Surgical consent,  Pre-op evaluation,  At surgeon's request and post-op pain management  Laterality: Left  Prep: chloraprep       Needles:  Injection technique: Single-shot  Needle Type: Echogenic Needle     Needle Length: 9cm  Needle Gauge: 21     Additional Needles:   Procedures: ultrasound guided,,,,,,,,  Narrative:  Start time: 03/29/2017 12:30 PM End time: 03/29/2017 12:40 PM Injection made incrementally with aspirations every 5 mL.  Performed by: Personally  Anesthesiologist: Catalina Gravel  Additional Notes: No pain on injection. No increased resistance to injection. Injection made in 5cc increments.  Good needle visualization.  Patient tolerated procedure well.

## 2017-03-29 NOTE — Op Note (Addendum)
Patient Name:           Jessica Ingram   Date of Surgery:        03/29/2017  Pre op Diagnosis:      Primary cancer upper outer quadrant of right female breast                                      Primary cancer upper inner quadrant left female breast                                      Right axillary seroma, subsequent encounter    Post op Diagnosis:    Same  Procedure:                 Needle aspiration right axillary seroma                                      Inject blue dye left breast                                      Left axillary deep sentinel node biopsy  Surgeon:                     Edsel Petrin. Dalbert Batman, M.D., FACS  Assistant:                      OR staff   Indication for Assistant: n/a  Operative Indications:   This is a delightful 66 year old woman who returns for her first postop visit. Dr. Lisbeth Renshaw and Dr. Jana Hakim are involved in her care. Her PCP is Marda Stalker.      On Mar 05, 2017 she underwent left breast lumpectomy with radioactive seed localization because of a discordant biopsy. She also underwent right breast lumpectomy and right axillary sentinel lymph node biopsy because of a biopsy-proven cancer in the upper outer quadrant.      On the right side. 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 All 4 lymph nodes were negative. ER 100%, PR 95%, HER-2/neu negative, Ki-67 5%. 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 Close posterior margin which is the muscle. ER 90%. PR 0. Ki-67 5%, HER-2 negative. Preoperative genetics testing was negative.      She is doing fairly well except for some swelling in her right axilla. This turned out to be a large seroma and we aspirated that today with good result Dr. Jana Hakim has requested ONCOTYPE on both cancers Consensus view is to proceed with left axillary sentinel node biopsy, as this may influence  radiation therapy and chemotherapy. We had a very long discussion about the sentinel lymph node biopsy. She is willing to have this done. I told her that she may or may not need chemotherapy but that a decision about chemotherapy and a Port-A-Cath will be made later after all of the information was in.      She is scheduled for injection of blue dye left breast, left axillary deep sentinel lymph node biopsy.Marland Kitchen She  agrees with this plan.   Operative Findings:       The right axillary seroma is smaller but has recurred.  We aspirated 20 mL of clear fluid from the right axilla.  We found 3 sentinel lymph nodes in the left axilla.  They were not grossly abnormal  Procedure in Detail:          Following the induction of general LMA anesthesia a surgical timeout was performed and intravenous antibiotics were given.     Following alcohol prep I injected 5 mL of dilute methylene blue into the left breast periareolar and lower outer quadrant and massage the breast for a few minutes.  Technetium 99 had been injected by the nuclear medicine technician preop      Following alcohol prep I aspirated the right axillary seroma.  There were 20 mL of clear fluid and the seroma was completely resolved.       The left breast and axilla were then prepped and draped in a sterile fashion.  Using the neoprobe I  identified an area of high radioactivity in the left axilla.   0.5% Marcaine with epinephrine was used as local infiltration anesthetic.  A transverse incision was made in the left axilla at the hairline.  Dissection was carried down through the clavipectoral fascia.  I Identified 3 sentinel lymph nodes and conservatively dissected these out 1 at a time and they were sent separately to the lab.  Hemostasis was excellent and the wound was irrigated.  The clavipectoral fascia was closed with interrupted 3-0 Vicryl sutures and the skin closed with a running subcuticular 4-0 Monocryl and Dermabond.  The patient tolerated  the procedure well was taken to PACU in stable condition.  EBL 10 mL.  Counts correct.  Complications none.     Edsel Petrin. Dalbert Batman, M.D., FACS General and Minimally Invasive Surgery Breast and Colorectal Surgery  03/29/2017 1:45 PM

## 2017-03-29 NOTE — Discharge Instructions (Signed)
[facility] °Office Phone Number [phone] ° °BREAST BIOPSY/ PARTIAL MASTECTOMY: POST OP INSTRUCTIONS ° °Always review your discharge instruction sheet given to you by the facility where your surgery was performed. ° °IF YOU HAVE DISABILITY OR FAMILY LEAVE FORMS, YOU MUST BRING THEM TO THE OFFICE FOR PROCESSING.  DO NOT GIVE THEM TO YOUR DOCTOR. ° °1. A prescription for pain medication may be given to you upon discharge.  Take your pain medication as prescribed, if needed.  If narcotic pain medicine is not needed, then you may take acetaminophen (Tylenol) or ibuprofen (Advil) as needed. °2. Take your usually prescribed medications unless otherwise directed °3. If you need a refill on your pain medication, please contact your pharmacy.  They will contact our office to request authorization.  Prescriptions will not be filled after 5pm or on week-ends. °4. You should eat very light the first 24 hours after surgery, such as soup, crackers, pudding, etc.  Resume your normal diet the day after surgery. °5. Most patients will experience some swelling and bruising in the breast.  Ice packs and a good support bra will help.  Swelling and bruising can take several days to resolve.  °6. It is common to experience some constipation if taking pain medication after surgery.  Increasing fluid intake and taking a stool softener will usually help or prevent this problem from occurring.  A mild laxative (Milk of Magnesia or Miralax) should be taken according to package directions if there are no bowel movements after 48 hours. °7. Unless discharge instructions indicate otherwise, you may remove your bandages 24-48 hours after surgery, and you may shower at that time.  You may have steri-strips (small skin tapes) in place directly over the incision.  These strips should be left on the skin for 7-10 days.  If your surgeon used skin glue on the incision, you may shower in 24 hours.  The glue will flake off over the  next 2-3 weeks.  Any sutures or staples will be removed at the office during your follow-up visit. °8. ACTIVITIES:  You may resume regular daily activities (gradually increasing) beginning the next day.  Wearing a good support bra or sports bra minimizes pain and swelling.  You may have sexual intercourse when it is comfortable. °a. You may drive when you no longer are taking prescription pain medication, you can comfortably wear a seatbelt, and you can safely maneuver your car and apply brakes. °b. RETURN TO WORK:  ______________________________________________________________________________________ °9. You should see your doctor in the office for a follow-up appointment approximately two weeks after your surgery.  Your doctor’s nurse will typically make your follow-up appointment when she calls you with your pathology report.  Expect your pathology report 2-3 business days after your surgery.  You may call to check if you do not hear from us after three days. °10. OTHER INSTRUCTIONS: _______________________________________________________________________________________________ _____________________________________________________________________________________________________________________________________ °_____________________________________________________________________________________________________________________________________ °_____________________________________________________________________________________________________________________________________ ° °WHEN TO CALL YOUR DOCTOR: °1. Fever over 101.0 °2. Nausea and/or vomiting. °3. Extreme swelling or bruising. °4. Continued bleeding from incision. °5. Increased pain, redness, or drainage from the incision. ° °The clinic staff is available to answer your questions during regular business hours.  Please don’t hesitate to call and ask to speak to one of the nurses for clinical concerns.  If you have a medical emergency, go to the nearest  emergency room or call 911.  A surgeon from [facility] is always on call at the hospital. ° °For further questions, please visit [email or web address]  ° ° ° ° °  Post Anesthesia Home Care Instructions ° °Activity: °Get plenty of rest for the remainder of the day. A responsible individual must stay with you for 24 hours following the procedure.  °For the next 24 hours, DO NOT: °-Drive a car °-Operate machinery °-Drink alcoholic beverages °-Take any medication unless instructed by your physician °-Make any legal decisions or sign important papers. ° °Meals: °Start with liquid foods such as gelatin or soup. Progress to regular foods as tolerated. Avoid greasy, spicy, heavy foods. If nausea and/or vomiting occur, drink only clear liquids until the nausea and/or vomiting subsides. Call your physician if vomiting continues. ° °Special Instructions/Symptoms: °Your throat may feel dry or sore from the anesthesia or the breathing tube placed in your throat during surgery. If this causes discomfort, gargle with warm salt water. The discomfort should disappear within 24 hours. ° °If you had a scopolamine patch placed behind your ear for the management of post- operative nausea and/or vomiting: ° °1. The medication in the patch is effective for 72 hours, after which it should be removed.  Wrap patch in a tissue and discard in the trash. Wash hands thoroughly with soap and water. °2. You may remove the patch earlier than 72 hours if you experience unpleasant side effects which may include dry mouth, dizziness or visual disturbances. °3. Avoid touching the patch. Wash your hands with soap and water after contact with the patch. °  ° °

## 2017-03-29 NOTE — Anesthesia Procedure Notes (Signed)
Procedure Name: Intubation Date/Time: 03/29/2017 1:12 PM Performed by: Lyndee Leo Pre-anesthesia Checklist: Patient identified, Emergency Drugs available, Suction available and Patient being monitored Patient Re-evaluated:Patient Re-evaluated prior to inductionOxygen Delivery Method: Circle system utilized Preoxygenation: Pre-oxygenation with 100% oxygen Intubation Type: IV induction Ventilation: Mask ventilation without difficulty LMA Size: 4.0 Tube type: Oral Number of attempts: 1 Airway Equipment and Method: Stylet and Oral airway Placement Confirmation: ETT inserted through vocal cords under direct vision,  positive ETCO2 and breath sounds checked- equal and bilateral Tube secured with: Tape Dental Injury: Teeth and Oropharynx as per pre-operative assessment

## 2017-03-29 NOTE — Anesthesia Preprocedure Evaluation (Signed)
Anesthesia Evaluation  Patient identified by MRN, date of birth, ID band Patient awake    Reviewed: Allergy & Precautions, NPO status , Patient's Chart, lab work & pertinent test results  Airway Mallampati: II  TM Distance: >3 FB Neck ROM: Full    Dental  (+) Teeth Intact, Dental Advisory Given   Pulmonary neg pulmonary ROS,    Pulmonary exam normal breath sounds clear to auscultation       Cardiovascular negative cardio ROS Normal cardiovascular exam Rhythm:Regular Rate:Normal     Neuro/Psych negative neurological ROS     GI/Hepatic negative GI ROS, Neg liver ROS,   Endo/Other  negative endocrine ROS  Renal/GU negative Renal ROS     Musculoskeletal  (+) Arthritis ,   Abdominal   Peds  Hematology negative hematology ROS (+)   Anesthesia Other Findings Day of surgery medications reviewed with the patient.  Bilateral breast cancer  Reproductive/Obstetrics                             Anesthesia Physical Anesthesia Plan  ASA: II  Anesthesia Plan: General   Post-op Pain Management:  Regional for Post-op pain   Induction: Intravenous  Airway Management Planned: LMA  Additional Equipment:   Intra-op Plan:   Post-operative Plan: Extubation in OR  Informed Consent: I have reviewed the patients History and Physical, chart, labs and discussed the procedure including the risks, benefits and alternatives for the proposed anesthesia with the patient or authorized representative who has indicated his/her understanding and acceptance.   Dental advisory given  Plan Discussed with: CRNA  Anesthesia Plan Comments: (Left PEC block)        Anesthesia Quick Evaluation

## 2017-03-29 NOTE — Transfer of Care (Signed)
Immediate Anesthesia Transfer of Care Note  Patient: Jessica Ingram  Procedure(s) Performed: Procedure(s): ASPIRATION OF SEROMA RIGHT AXILLA (Right) LEFT AXILLARY SENTINEL NODE BIOPSY, INJECT BLUE DYE LEFT BREAST (Left)  Patient Location: PACU  Anesthesia Type:Gen with Block  Level of Consciousness: awake, sedated and patient cooperative  Airway & Oxygen Therapy: Patient Spontanous Breathing and Patient connected to face mask oxygen  Post-op Assessment: Report given to RN and Post -op Vital signs reviewed and stable  Post vital signs: Reviewed and stable  Last Vitals:  Vitals:   03/29/17 1255 03/29/17 1300  BP: 136/69 137/65  Pulse: 82   Resp: (!) 21   Temp:      Last Pain:  Vitals:   03/29/17 1129  TempSrc: Oral         Complications: No apparent anesthesia complications

## 2017-03-29 NOTE — Progress Notes (Signed)
Assisted nuc med tech [phone]  With nuc med  inj Side rails up, monitors on throughout procedure. See vital signs in flow sheet. Tolerated Procedure well.

## 2017-03-30 ENCOUNTER — Encounter (HOSPITAL_BASED_OUTPATIENT_CLINIC_OR_DEPARTMENT_OTHER): Payer: Self-pay | Admitting: General Surgery

## 2017-03-30 NOTE — Anesthesia Postprocedure Evaluation (Signed)
Anesthesia Post Note  Patient: Jessica Ingram  Procedure(s) Performed: Procedure(s) (LRB): ASPIRATION OF SEROMA RIGHT AXILLA (Right) LEFT AXILLARY SENTINEL LYMPH NODE BIOPSY, INJECT BLUE DYE LEFT BREAST (Left)     Patient location during evaluation: PACU Anesthesia Type: General Level of consciousness: awake and alert Pain management: pain level controlled Vital Signs Assessment: post-procedure vital signs reviewed and stable Respiratory status: spontaneous breathing, nonlabored ventilation, respiratory function stable and patient connected to nasal cannula oxygen Cardiovascular status: blood pressure returned to baseline and stable Postop Assessment: no signs of nausea or vomiting Anesthetic complications: no    Last Vitals:  Vitals:   03/29/17 1415 03/29/17 1438  BP: (!) 161/84 133/88  Pulse: 79 74  Resp: 19 18  Temp:  36.5 C    Last Pain:  Vitals:   03/29/17 1438  TempSrc:   PainSc: 0-No pain   Pain Goal:                 Catalina Gravel

## 2017-03-31 ENCOUNTER — Ambulatory Visit: Payer: BLUE CROSS/BLUE SHIELD | Admitting: Oncology

## 2017-03-31 ENCOUNTER — Telehealth: Payer: Self-pay | Admitting: *Deleted

## 2017-03-31 NOTE — Progress Notes (Signed)
Inform patient of Pathology report,.  Tell her that all of her lymph nodes are negative for carcinoma.  This is excellent news. I will discuss this with her at her next office visit Send me a message and let me know that you talked to her.   hmi

## 2017-03-31 NOTE — Telephone Encounter (Signed)
Received Oncotype Dx score of 19/12%.  Placed a copy in Dr. Virgie Dad box, gave Varney Biles a copy and took a copy to HIM to scan.

## 2017-04-01 ENCOUNTER — Telehealth: Payer: Self-pay | Admitting: *Deleted

## 2017-04-01 NOTE — Telephone Encounter (Signed)
Spoke with patient and gave her results for her oncotype on the left side.  We are still waiting on the right side and should be released in another week.

## 2017-04-05 ENCOUNTER — Other Ambulatory Visit: Payer: Self-pay | Admitting: Oncology

## 2017-04-06 ENCOUNTER — Encounter (HOSPITAL_COMMUNITY): Payer: Self-pay

## 2017-04-06 ENCOUNTER — Telehealth: Payer: Self-pay | Admitting: *Deleted

## 2017-04-06 NOTE — Telephone Encounter (Signed)
Received oncotype results on right breast of 21.  No chemo needed. Spoke with patient and she is aware of the results.  She would like to cancel her appointment with Dr. Jana Hakim for 6/25 and reschedule after XRT complete.  Referral placed for Dr. Lisbeth Renshaw.

## 2017-04-07 ENCOUNTER — Encounter: Payer: Self-pay | Admitting: Radiation Oncology

## 2017-04-09 NOTE — Progress Notes (Signed)
Location of Breast Cancer:Upper-inner quadrant of right breast in female  Histology per Pathology Report: 03-29-17 Diagnosis 1. Lymph node, sentinel, biopsy, Left Axillary #1 - NO CARCINOMA IDENTIFIED IN ONE LYMPH NODE (0/1) 2. Lymph node, sentinel, biopsy, Left Axillary #2 - NO CARCINOMA IDENTIFIED IN ONE LYMPH NODE (0/1) 3. Lymph node, sentinel, biopsy, Left Axillary #3 - NO CARCINOMA IDENTIFIED IN ONE LYMPH NODE (0/1)   Diagnosis 03-05-17 1. 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 - ATYPICAL DUCTAL HYPERPLASIA. - LOBULAR NEOPLASIA (ATYPICAL LOBULAR HYPERPLASIA). - INVASIVE CARCINOMA IS FOCALLY 0.1 CM TO THE POSTERIOR MARGIN. - SEE ONCOLOGY TABLE BELOW. 2. Breast, lumpectomy, Right - INVASIVE DUCTAL CARCINOMA WITH CALCIFICATIONS, GRADE I/III, SPANNING 1.4 CM. - DUCTAL CARCINOMA IN SITU WITH CALCIFICATIONS, LOW GRADE. - INVASIVE CARCINOMA IS BROADLY 0.2 CM TO THE ANTERIOR MARGIN. - LOBULAR NEOPLASIA (LOBULAR CARCINOMA IN SITU). - SEE ONCOLOGY TABLE BELOW. 3. Lymph node, sentinel, biopsy, Right Axillary #1 - THERE IS NO EVIDENCE OF CARCINOMA IN 1 OF 1 LYMPH NODE (0/1). 4. Lymph node, sentinel, biopsy, Right Axillary #2 - THERE IS NO EVIDENCE OF CARCINOMA IN 1 OF 1 LYMPH NODE (0/1). 5. Lymph node, sentinel, biopsy, Right Axillary #3 - THERE IS NO EVIDENCE OF CARCINOMA IN 1 OF 1 LYMPH NODE (0/1). 6. Lymph node, sentinel, biopsy, Right Axillary #4 - THERE IS NO EVIDENCE OF CARCINOMA IN 1 OF 1 LYMPH NODE (0/1).   Receptor Status: ER(100% +), PR (95% +), Her2-neu (-), Ki-(5%)  Did patient present with symptoms (if so, please note symptoms) or was this found on screening mammography?: screening mammography   Past/Anticipated interventions by surgeon, if any: 03-29-17  Lymph node, sentinel, biopsy, Left Axillary, Dr. Voncille Lo    03-05-17  Breast, lumpectomy, Left    Past/Anticipated interventions by medical oncology, if any:  Chemotherapy   Lymphedema issues, if any:  No, Bee sting last night while walking her dot  Pain issues, if any: 5/10 right hand  SAFETY ISSUES:  Prior radiation? :No  Pacemaker/ICD? : No  Possible current pregnancy?:No  Is the patient on methotrexate? No   Menarche age 4, G46 P2  BC 77 years  Menopause  51, HRT Yes   Current Complaints / other details: 66 year old married woman with a strong family history of cancer of the colon brother and maternal aunt,prostate father,breast maternal ,and lung mother. Wt Readings from Last 3 Encounters:  04/15/17 138 lb 9.6 oz (62.9 kg)  03/29/17 134 lb (60.8 kg)  03/05/17 134 lb (60.8 kg)  BP (!) 160/97   Pulse 64   Temp 98.2 F (36.8 C) (Oral)   Resp 16   Ht 5' 6.5" (1.689 m)   Wt 138 lb 9.6 oz (62.9 kg)   SpO2 100%   BMI 22.04 kg/m Right ankle sitting    Jessica Spurling, RN 04/09/2017,9:17 AM

## 2017-04-14 NOTE — Progress Notes (Addendum)
Radiation Oncology         [phone] ________________________________  Name: Jessica Ingram MRN: [id number]  Date: 04/15/2017  DOB: 1951-08-15   CC:Jessica Stalker, PA-C  Magrinat, Jessica Dad, MD     REFERRING PHYSICIAN: Magrinat, Jessica Dad, MD   DIAGNOSIS: The primary encounter diagnosis was Malignant neoplasm of upper-inner quadrant of right breast in female, estrogen receptor positive ([place]). A diagnosis of Malignant neoplasm of upper-inner quadrant of left breast in female, estrogen receptor positive ([place]) was also pertinent to this visit.   HISTORY OF PRESENT ILLNESS: Jessica Ingram is a 66 y.o. female with bilateral breast cancer, originally found to have screening dectected abnormality of bilateral breasts which prompted ultrasound. Within the right breast there was a 1.1 cm mass, and the axilla was negative. On the left, there is a 9 mm lesion as well, and axilla was also negative. 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 The left breast biopsy on 02/08/17 revealed fibrocystic change and was felt to be discordant. She subsequently underwent bilateral lumpectomy and right sentinel node assessment on 03/05/17. Her right lumpectomy revealed a grade 1, 1.4 cm tumor consistent with invasive and insitue carcinoma and LCIS with negative margins, and 4 negative nodes. The left lumpectomy revealed a grade 1, 1.2 cm tumor with invasive ductal carcinoma and atypical lobular and ductal hyperplasia, with carcinoma focally 1 mm to the posterior margin. She was taken back to the OR on 03/29/17 for left sentinel node assessment and her nodes were all negative. She had oncotype performed on both breast specimens and the right revealed a score of 21, and the left revealed a score of 19. She will not receive chemotherapy, and comes today to discuss the role of raditoherapy    PREVIOUS RADIATION THERAPY: No   PAST  MEDICAL HISTORY:  Past Medical History:  Diagnosis Date  . Arthritis   . Genetic testing 02/19/2017   Ms. Valladolid underwent genetic counseling and testing for hereditary cancer syndromes on 02/02/2017. Her results were negative for mutations in all 46 genes analyzed by [facility]'s 46-gene Common Hereditary Cancers Panel. Genes analyzed include: APC, ATM, AXIN2, BARD1, BMPR1A, BRCA1, BRCA2, BRIP1, CDH1, CDKN2A, CHEK2, CTNNA1, DICER1, EPCAM, GREM1, HOXB13, KIT, MEN1, MLH1, MSH2, MSH3, MSH6, MUTYH, NBN,  . Hepatitis    at age 45       PAST SURGICAL HISTORY: Past Surgical History:  Procedure Laterality Date  . ANTERIOR CRUCIATE LIGAMENT REPAIR Left   . AXILLARY SENTINEL NODE BIOPSY Left 03/29/2017   Procedure: LEFT AXILLARY SENTINEL LYMPH NODE BIOPSY, INJECT BLUE DYE LEFT BREAST;  Surgeon: Jessica Skates, MD;  Location: [facility];  Service: General;  Laterality: Left;  . BREAST LUMPECTOMY WITH RADIOACTIVE SEED AND SENTINEL LYMPH NODE BIOPSY Bilateral 03/05/2017   Procedure: BILATERAL BREAST LUMPECTOMY WITH RADIOACTIVE SEED AND RIGHT AXILLARY SENTINEL LYMPH NODE BIOPSY;  Surgeon: Jessica Skates, MD;  Location: [facility];  Service: General;  Laterality: Bilateral;  . FINE NEEDLE ASPIRATION Right 03/29/2017   Procedure: ASPIRATION OF SEROMA RIGHT AXILLA;  Surgeon: Jessica Skates, MD;  Location: [facility];  Service: General;  Laterality: Right;     FAMILY HISTORY:  Family History  Problem Relation Age of Onset  . Lung cancer Mother 51       d.93 history of smoking  . Prostate cancer Father 51       d.83 prostate cancer metastasized to  bone  . Colon cancer Brother 70       d.53  . Breast cancer Maternal Aunt 73       d.85s  . Colon cancer Maternal Aunt 83  . Bone cancer Paternal Aunt        d.85  . Prostate cancer Paternal Uncle 52       d.72s metastasized to bladder  . Kidney cancer Maternal Grandfather 49       d.53 possible colon cancer  .  Lung cancer Paternal Uncle 76       d.60  . Brain cancer Cousin 56       d.11 paternal first-cousin. Son of uncle with prostate cancer.  . Prostate cancer Paternal Grandfather        d.89     SOCIAL HISTORY:  reports that she has never smoked. She has never used smokeless tobacco. She reports that she drinks alcohol. She reports that she does not use drugs. The patient is married and lives in [place]. She works in a Sales promotion account executive.    ALLERGIES: Patient has no known allergies.   MEDICATIONS:  Current Outpatient Prescriptions  Medication Sig Dispense Refill  . HYDROcodone-acetaminophen (NORCO) 5-325 MG tablet Take 1-2 tablets by mouth every 6 (six) hours as needed for moderate pain or severe pain. (Patient not taking: Reported on 04/15/2017) 30 tablet 0  . HYDROcodone-acetaminophen (NORCO) 5-325 MG tablet Take 1-2 tablets by mouth every 6 (six) hours as needed. (Patient not taking: Reported on 04/15/2017) 25 tablet 0   No current facility-administered medications for this encounter.      REVIEW OF SYSTEMS: On review of systems, the patient reports that she is doing well overall. She feels as though she's healed well and in general denies any chest pain, shortness of breath, cough, fevers, chills, night sweats, unintended weight changes. She denies any edema from surgery, however last night got stung by a bee on her left finger, and her left hand has been edematous, warm to touch, and itchy. She's iced her hand and used ibuprofen and benadryl with minimal improvement. She denies any trouble swallowing, breathing, or chest pain after this. She denies any bowel or bladder disturbances, and denies abdominal pain, nausea or vomiting. She denies any new musculoskeletal or joint aches or pains, new skin lesions or concerns. A complete review of systems is obtained and is otherwise negative.     PHYSICAL EXAM:  Wt Readings from Last 3 Encounters:  04/15/17 138 lb 9.6 oz (62.9 kg)  03/29/17  134 lb (60.8 kg)  03/05/17 134 lb (60.8 kg)   Temp Readings from Last 3 Encounters:  04/15/17 98.2 F (36.8 C) (Oral)  03/29/17 97.7 F (36.5 C)  03/05/17 97.7 F (36.5 C)   BP Readings from Last 3 Encounters:  04/15/17 (!) 160/97  03/29/17 133/88  03/05/17 (!) 144/75   Pulse Readings from Last 3 Encounters:  04/15/17 64  03/29/17 74  03/05/17 96   Pain Assessment Pain Score: 0-No pain (B/P Right ankle) In general this is a well appearing caucasian female in no acute distress. She's alert and oriented x4 and appropriate throughout the examination. Cardiopulmonary assessment is negative for acute distress and she exhibits normal effort. Bilateral breasts are examined and reveals well healed bilateral lumpectomy and axillary scars. No chest wall edema is noted of either side, but she has 1+ edema of the right hand and extending up to the mid forearm. The skin is tight, warm to the touch, and sore  to touch.   ECOG = 0  0 - Asymptomatic (Fully active, able to carry on all predisease activities without restriction)  1 - Symptomatic but completely ambulatory (Restricted in physically strenuous activity but ambulatory and able to carry out work of a light or sedentary nature. For example, light housework, office work)  2 - Symptomatic, <50% in bed during the day (Ambulatory and capable of all self care but unable to carry out any work activities. Up and about more than 50% of waking hours)  3 - Symptomatic, >50% in bed, but not bedbound (Capable of only limited self-care, confined to bed or chair 50% or more of waking hours)  4 - Bedbound (Completely disabled. Cannot carry on any self-care. Totally confined to bed or chair)  5 - Death   Eustace Pen MM, Creech RH, Tormey DC, et al. [phone]). "Toxicity and response criteria of the [facility]". [place] Oncol. 5 (6): 649-55    LABORATORY DATA:  Lab Results  Component Value Date   WBC 4.6 01/20/2017   HGB 14.1  01/20/2017   HCT 40.7 01/20/2017   MCV 95.5 01/20/2017   PLT 183 01/20/2017   Lab Results  Component Value Date   NA 140 01/20/2017   K 3.9 01/20/2017   CO2 26 01/20/2017   Lab Results  Component Value Date   ALT 20 01/20/2017   AST 20 01/20/2017   ALKPHOS 58 01/20/2017   BILITOT 0.58 01/20/2017      RADIOGRAPHY: Nm Sentinel Node Inj-no Rpt (breast)  Result Date: 04/09/2017 There is no Radiologist interpretation  for this exam.      IMPRESSION/PLAN: 1. 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 Lisbeth Renshaw discusses the pathology results including her surgical specimens and recent lymph node assessment. He recommends a course of adjuvant external radiotherapy to the breasts followed by antiestrogen therapy. We discussed the risks, benefits, short, and long term effects of radiotherapy, and the patient is interested in proceeding. Dr. Lisbeth Renshaw discusses the delivery and logistics of radiotherapy, and he anticipates a course of 4 weeks of radiation with deep breath hold inspiration on the left. We will proceed with simulation next week. Written consent is obtained and placed in the chart, a copy was provided to the patient. 2. Bee Sting. I've encouraged the patient to follow up with her PCP today and to consider antihistamines with Zyrtec/Claritin during the day and Benadryl at night. We will follow this closely if her symptoms persist or progress given the edema and concerns for possibility of developing lymphedema. The above documentation reflects my direct findings during this shared patient visit. Please see the separate note by Dr. Lisbeth Renshaw on this date for the remainder of the patient's plan of care.  In a visit lasting 25 minutes, greater than 50% of the time was spent face to face discussing her pathology results, and coordinating the  patient's care.     Carola Rhine, PAC

## 2017-04-15 ENCOUNTER — Ambulatory Visit
Admission: RE | Admit: 2017-04-15 | Discharge: 2017-04-15 | Disposition: A | Discharge status: Home / Self Care | Payer: BLUE CROSS/BLUE SHIELD | Source: Ambulatory Visit | Attending: Radiation Oncology | Admitting: Radiation Oncology

## 2017-04-15 ENCOUNTER — Encounter: Payer: Self-pay | Admitting: Radiation Oncology

## 2017-04-15 VITALS — BP 160/97 | HR 64 | Temp 98.2°F | Resp 16 | Ht 66.5 in | Wt 138.6 lb

## 2017-04-15 DIAGNOSIS — T63441A Toxic effect of venom of bees, accidental (unintentional), initial encounter: Secondary | ICD-10-CM | POA: Insufficient documentation

## 2017-04-15 DIAGNOSIS — C50211 Malignant neoplasm of upper-inner quadrant of right female breast: Secondary | ICD-10-CM

## 2017-04-15 DIAGNOSIS — Z800 Family history of malignant neoplasm of digestive organs: Secondary | ICD-10-CM | POA: Diagnosis not present

## 2017-04-15 DIAGNOSIS — Z170 Estrogen receptor positive status [ER+]: Secondary | ICD-10-CM

## 2017-04-15 DIAGNOSIS — Z8042 Family history of malignant neoplasm of prostate: Secondary | ICD-10-CM | POA: Diagnosis not present

## 2017-04-15 DIAGNOSIS — Z510 Encounter for antineoplastic radiation therapy: Secondary | ICD-10-CM | POA: Diagnosis present

## 2017-04-15 DIAGNOSIS — C50212 Malignant neoplasm of upper-inner quadrant of left female breast: Secondary | ICD-10-CM | POA: Insufficient documentation

## 2017-04-15 DIAGNOSIS — Z801 Family history of malignant neoplasm of trachea, bronchus and lung: Secondary | ICD-10-CM | POA: Insufficient documentation

## 2017-04-15 DIAGNOSIS — M1990 Unspecified osteoarthritis, unspecified site: Secondary | ICD-10-CM | POA: Insufficient documentation

## 2017-04-19 ENCOUNTER — Ambulatory Visit: Payer: BLUE CROSS/BLUE SHIELD | Admitting: Oncology

## 2017-04-20 ENCOUNTER — Ambulatory Visit
Admission: RE | Admit: 2017-04-20 | Discharge: 2017-04-20 | Disposition: A | Discharge status: Home / Self Care | Payer: BLUE CROSS/BLUE SHIELD | Source: Ambulatory Visit | Attending: Radiation Oncology | Admitting: Radiation Oncology

## 2017-04-20 DIAGNOSIS — Z510 Encounter for antineoplastic radiation therapy: Secondary | ICD-10-CM | POA: Diagnosis not present

## 2017-04-20 DIAGNOSIS — C50211 Malignant neoplasm of upper-inner quadrant of right female breast: Secondary | ICD-10-CM

## 2017-04-20 DIAGNOSIS — C50212 Malignant neoplasm of upper-inner quadrant of left female breast: Secondary | ICD-10-CM

## 2017-04-20 DIAGNOSIS — Z170 Estrogen receptor positive status [ER+]: Secondary | ICD-10-CM

## 2017-04-23 DIAGNOSIS — Z510 Encounter for antineoplastic radiation therapy: Secondary | ICD-10-CM | POA: Diagnosis not present

## 2017-04-26 ENCOUNTER — Ambulatory Visit: Payer: BLUE CROSS/BLUE SHIELD | Admitting: Oncology

## 2017-04-27 ENCOUNTER — Ambulatory Visit
Admission: RE | Admit: 2017-04-27 | Discharge: 2017-04-27 | Disposition: A | Discharge status: Home / Self Care | Payer: BLUE CROSS/BLUE SHIELD | Source: Ambulatory Visit | Attending: Radiation Oncology | Admitting: Radiation Oncology

## 2017-04-27 DIAGNOSIS — Z510 Encounter for antineoplastic radiation therapy: Secondary | ICD-10-CM | POA: Diagnosis not present

## 2017-04-29 ENCOUNTER — Ambulatory Visit
Admission: RE | Admit: 2017-04-29 | Discharge: 2017-04-29 | Disposition: A | Discharge status: Home / Self Care | Payer: BLUE CROSS/BLUE SHIELD | Source: Ambulatory Visit | Attending: Radiation Oncology | Admitting: Radiation Oncology

## 2017-04-29 DIAGNOSIS — Z170 Estrogen receptor positive status [ER+]: Secondary | ICD-10-CM

## 2017-04-29 DIAGNOSIS — C50212 Malignant neoplasm of upper-inner quadrant of left female breast: Secondary | ICD-10-CM

## 2017-04-29 DIAGNOSIS — Z510 Encounter for antineoplastic radiation therapy: Secondary | ICD-10-CM | POA: Diagnosis not present

## 2017-04-29 MED ORDER — ALRA NON-METALLIC DEODORANT (RAD-ONC)
1.0000 "application " | Freq: Once | TOPICAL | Status: AC
  Administered 2017-04-29: 1 via TOPICAL

## 2017-04-29 MED ORDER — RADIAPLEXRX EX GEL
Freq: Once | CUTANEOUS | Status: AC
  Administered 2017-04-29: 12:00:00 via TOPICAL

## 2017-04-29 NOTE — Progress Notes (Signed)
Pt education, my business card, Radiation therapy and you book, alra deodorant, radiaplex gel cream, given,  Discussed skin irritation, pain, fatigue, increase protein and fluids in diet,stay hydrated, luke warm showers,pat dry, dove soap unscented preferred, no rubbing,scrubbing, or scratching area, apply radiaplex bid after rad tx and bedtime daily, alrqa after rad tx and prn, nothing 4 hours prior to treatment, no under wire bras, sees MD weekly and prn, teach back 12:11 PM

## 2017-04-30 ENCOUNTER — Ambulatory Visit
Admission: RE | Admit: 2017-04-30 | Discharge: 2017-04-30 | Disposition: A | Discharge status: Home / Self Care | Payer: BLUE CROSS/BLUE SHIELD | Source: Ambulatory Visit | Attending: Radiation Oncology | Admitting: Radiation Oncology

## 2017-04-30 DIAGNOSIS — Z510 Encounter for antineoplastic radiation therapy: Secondary | ICD-10-CM | POA: Diagnosis not present

## 2017-04-30 NOTE — Progress Notes (Signed)
  Radiation Oncology         [phone] ________________________________  Name: Jessica Ingram MRN: [id number]  Date: 04/20/2017  DOB: [birth year]  SIMULATION AND TREATMENT PLANNING NOTE  DIAGNOSIS:     ICD-10-CM   1. Malignant neoplasm of upper-inner quadrant of right breast in female, estrogen receptor positive ([place]) C50.211    Z17.0   2. Malignant neoplasm of upper-inner quadrant of left breast in female, estrogen receptor positive ([place]) C50.212    Z17.0      Site:   1.  Right breast 2.  Left breast  NARRATIVE:  The patient was brought to the [facility].  Identity was confirmed.  All relevant records and images related to the planned course of therapy were reviewed.   Written consent to proceed with treatment was confirmed which was freely given after reviewing the details related to the planned course of therapy had been reviewed with the patient.  Then, the patient was set-up in a stable reproducible  supine position for radiation therapy.  CT images were obtained.  Surface markings were placed.    Medically necessary complex treatment device(s) for immobilization:  Customized vac lock bag.   The CT images were loaded into the planning software.  Then the target and avoidance structures were contoured.  Treatment planning then occurred.  The radiation prescription was entered and confirmed.  A total of 4 complex treatment devices were fabricated which relate to the designed radiation treatment fields. Each of these customized fields/ complex treatment devices will be used on a daily basis during the radiation course. A reduced field technique will also be utilized to improve the dose homogeneity of the plan I have requested : 3D Simulation  I have requested a DVH of the following structures: Target volume, heart, lungs.   PLAN:  The patient will receive 42.5 Gy in 16 fractions to both the left breast and the right breast concurrently as separate target regions. It is  anticipated that each breast will receive a 7.5 gray boost to yield a final dose of 50 gray.  ________________________________   Jodelle Gross, MD, PhD

## 2017-05-03 ENCOUNTER — Ambulatory Visit
Admission: RE | Admit: 2017-05-03 | Discharge: 2017-05-03 | Disposition: A | Discharge status: Home / Self Care | Payer: BLUE CROSS/BLUE SHIELD | Source: Ambulatory Visit | Attending: Radiation Oncology | Admitting: Radiation Oncology

## 2017-05-03 DIAGNOSIS — Z510 Encounter for antineoplastic radiation therapy: Secondary | ICD-10-CM | POA: Diagnosis not present

## 2017-05-04 ENCOUNTER — Ambulatory Visit
Admission: RE | Admit: 2017-05-04 | Discharge: 2017-05-04 | Disposition: A | Discharge status: Home / Self Care | Payer: BLUE CROSS/BLUE SHIELD | Source: Ambulatory Visit | Attending: Radiation Oncology | Admitting: Radiation Oncology

## 2017-05-04 DIAGNOSIS — Z510 Encounter for antineoplastic radiation therapy: Secondary | ICD-10-CM | POA: Diagnosis not present

## 2017-05-05 ENCOUNTER — Ambulatory Visit
Admission: RE | Admit: 2017-05-05 | Discharge: 2017-05-05 | Disposition: A | Discharge status: Home / Self Care | Payer: BLUE CROSS/BLUE SHIELD | Source: Ambulatory Visit | Attending: Radiation Oncology | Admitting: Radiation Oncology

## 2017-05-05 DIAGNOSIS — Z510 Encounter for antineoplastic radiation therapy: Secondary | ICD-10-CM | POA: Diagnosis not present

## 2017-05-06 ENCOUNTER — Ambulatory Visit
Admission: RE | Admit: 2017-05-06 | Discharge: 2017-05-06 | Disposition: A | Discharge status: Home / Self Care | Payer: BLUE CROSS/BLUE SHIELD | Source: Ambulatory Visit | Attending: Radiation Oncology | Admitting: Radiation Oncology

## 2017-05-06 DIAGNOSIS — Z510 Encounter for antineoplastic radiation therapy: Secondary | ICD-10-CM | POA: Diagnosis not present

## 2017-05-07 ENCOUNTER — Ambulatory Visit
Admission: RE | Admit: 2017-05-07 | Discharge: 2017-05-07 | Disposition: A | Discharge status: Home / Self Care | Payer: BLUE CROSS/BLUE SHIELD | Source: Ambulatory Visit | Attending: Radiation Oncology | Admitting: Radiation Oncology

## 2017-05-07 DIAGNOSIS — Z510 Encounter for antineoplastic radiation therapy: Secondary | ICD-10-CM | POA: Diagnosis not present

## 2017-05-10 ENCOUNTER — Ambulatory Visit
Admission: RE | Admit: 2017-05-10 | Discharge: 2017-05-10 | Disposition: A | Discharge status: Home / Self Care | Payer: BLUE CROSS/BLUE SHIELD | Source: Ambulatory Visit | Attending: Radiation Oncology | Admitting: Radiation Oncology

## 2017-05-10 DIAGNOSIS — Z510 Encounter for antineoplastic radiation therapy: Secondary | ICD-10-CM | POA: Diagnosis not present

## 2017-05-11 ENCOUNTER — Ambulatory Visit
Admission: RE | Admit: 2017-05-11 | Discharge: 2017-05-11 | Disposition: A | Discharge status: Home / Self Care | Payer: BLUE CROSS/BLUE SHIELD | Source: Ambulatory Visit | Attending: Radiation Oncology | Admitting: Radiation Oncology

## 2017-05-11 DIAGNOSIS — Z510 Encounter for antineoplastic radiation therapy: Secondary | ICD-10-CM | POA: Diagnosis not present

## 2017-05-12 ENCOUNTER — Ambulatory Visit
Admission: RE | Admit: 2017-05-12 | Discharge: 2017-05-12 | Disposition: A | Discharge status: Home / Self Care | Payer: BLUE CROSS/BLUE SHIELD | Source: Ambulatory Visit | Attending: Radiation Oncology | Admitting: Radiation Oncology

## 2017-05-12 DIAGNOSIS — Z510 Encounter for antineoplastic radiation therapy: Secondary | ICD-10-CM | POA: Diagnosis not present

## 2017-05-13 ENCOUNTER — Ambulatory Visit
Admission: RE | Admit: 2017-05-13 | Discharge: 2017-05-13 | Disposition: A | Discharge status: Home / Self Care | Payer: BLUE CROSS/BLUE SHIELD | Source: Ambulatory Visit | Attending: Radiation Oncology | Admitting: Radiation Oncology

## 2017-05-13 DIAGNOSIS — Z510 Encounter for antineoplastic radiation therapy: Secondary | ICD-10-CM | POA: Diagnosis not present

## 2017-05-14 ENCOUNTER — Ambulatory Visit
Admission: RE | Admit: 2017-05-14 | Discharge: 2017-05-14 | Disposition: A | Discharge status: Home / Self Care | Payer: BLUE CROSS/BLUE SHIELD | Source: Ambulatory Visit | Attending: Radiation Oncology | Admitting: Radiation Oncology

## 2017-05-14 DIAGNOSIS — Z510 Encounter for antineoplastic radiation therapy: Secondary | ICD-10-CM | POA: Diagnosis not present

## 2017-05-17 ENCOUNTER — Ambulatory Visit
Admission: RE | Admit: 2017-05-17 | Discharge: 2017-05-17 | Disposition: A | Discharge status: Home / Self Care | Payer: BLUE CROSS/BLUE SHIELD | Source: Ambulatory Visit | Attending: Radiation Oncology | Admitting: Radiation Oncology

## 2017-05-17 DIAGNOSIS — Z510 Encounter for antineoplastic radiation therapy: Secondary | ICD-10-CM | POA: Diagnosis not present

## 2017-05-18 ENCOUNTER — Ambulatory Visit
Admission: RE | Admit: 2017-05-18 | Discharge: 2017-05-18 | Disposition: A | Discharge status: Home / Self Care | Payer: BLUE CROSS/BLUE SHIELD | Source: Ambulatory Visit | Attending: Radiation Oncology | Admitting: Radiation Oncology

## 2017-05-18 DIAGNOSIS — Z510 Encounter for antineoplastic radiation therapy: Secondary | ICD-10-CM | POA: Diagnosis not present

## 2017-05-19 ENCOUNTER — Ambulatory Visit
Admission: RE | Admit: 2017-05-19 | Discharge: 2017-05-19 | Disposition: A | Discharge status: Home / Self Care | Payer: BLUE CROSS/BLUE SHIELD | Source: Ambulatory Visit | Attending: Radiation Oncology | Admitting: Radiation Oncology

## 2017-05-19 DIAGNOSIS — Z510 Encounter for antineoplastic radiation therapy: Secondary | ICD-10-CM | POA: Diagnosis not present

## 2017-05-20 ENCOUNTER — Ambulatory Visit
Admission: RE | Admit: 2017-05-20 | Discharge: 2017-05-20 | Disposition: A | Discharge status: Home / Self Care | Payer: BLUE CROSS/BLUE SHIELD | Source: Ambulatory Visit | Attending: Radiation Oncology | Admitting: Radiation Oncology

## 2017-05-20 DIAGNOSIS — Z510 Encounter for antineoplastic radiation therapy: Secondary | ICD-10-CM | POA: Diagnosis not present

## 2017-05-21 ENCOUNTER — Ambulatory Visit
Admission: RE | Admit: 2017-05-21 | Discharge: 2017-05-21 | Disposition: A | Discharge status: Home / Self Care | Payer: BLUE CROSS/BLUE SHIELD | Source: Ambulatory Visit | Attending: Radiation Oncology | Admitting: Radiation Oncology

## 2017-05-21 DIAGNOSIS — Z170 Estrogen receptor positive status [ER+]: Secondary | ICD-10-CM

## 2017-05-21 DIAGNOSIS — C50212 Malignant neoplasm of upper-inner quadrant of left female breast: Secondary | ICD-10-CM

## 2017-05-21 DIAGNOSIS — Z510 Encounter for antineoplastic radiation therapy: Secondary | ICD-10-CM | POA: Diagnosis not present

## 2017-05-21 MED ORDER — BIAFINE EX EMUL
Freq: Two times a day (BID) | CUTANEOUS | Status: DC
  Administered 2017-05-21: 09:00:00 via TOPICAL

## 2017-05-24 ENCOUNTER — Ambulatory Visit
Admission: RE | Admit: 2017-05-24 | Discharge: 2017-05-24 | Disposition: A | Discharge status: Home / Self Care | Payer: BLUE CROSS/BLUE SHIELD | Source: Ambulatory Visit | Attending: Radiation Oncology | Admitting: Radiation Oncology

## 2017-05-24 DIAGNOSIS — Z510 Encounter for antineoplastic radiation therapy: Secondary | ICD-10-CM | POA: Diagnosis not present

## 2017-05-25 ENCOUNTER — Ambulatory Visit
Admission: RE | Admit: 2017-05-25 | Discharge: 2017-05-25 | Disposition: A | Discharge status: Home / Self Care | Payer: BLUE CROSS/BLUE SHIELD | Source: Ambulatory Visit | Attending: Radiation Oncology | Admitting: Radiation Oncology

## 2017-05-25 DIAGNOSIS — Z510 Encounter for antineoplastic radiation therapy: Secondary | ICD-10-CM | POA: Diagnosis not present

## 2017-05-26 ENCOUNTER — Ambulatory Visit
Admission: RE | Admit: 2017-05-26 | Discharge: 2017-05-26 | Disposition: A | Discharge status: Home / Self Care | Payer: BLUE CROSS/BLUE SHIELD | Source: Ambulatory Visit | Attending: Radiation Oncology | Admitting: Radiation Oncology

## 2017-05-26 DIAGNOSIS — Z510 Encounter for antineoplastic radiation therapy: Secondary | ICD-10-CM | POA: Diagnosis not present

## 2017-06-02 ENCOUNTER — Encounter: Payer: Self-pay | Admitting: *Deleted

## 2017-06-02 ENCOUNTER — Encounter: Payer: Self-pay | Admitting: Radiation Oncology

## 2017-06-02 ENCOUNTER — Telehealth: Payer: Self-pay

## 2017-06-02 ENCOUNTER — Ambulatory Visit (HOSPITAL_BASED_OUTPATIENT_CLINIC_OR_DEPARTMENT_OTHER): Payer: BLUE CROSS/BLUE SHIELD | Admitting: Oncology

## 2017-06-02 VITALS — BP 128/77 | HR 69 | Temp 97.5°F | Resp 18 | Ht 66.5 in | Wt 136.1 lb

## 2017-06-02 DIAGNOSIS — Z170 Estrogen receptor positive status [ER+]: Secondary | ICD-10-CM

## 2017-06-02 DIAGNOSIS — C50212 Malignant neoplasm of upper-inner quadrant of left female breast: Secondary | ICD-10-CM

## 2017-06-02 DIAGNOSIS — C50211 Malignant neoplasm of upper-inner quadrant of right female breast: Secondary | ICD-10-CM

## 2017-06-02 MED ORDER — EMOLLIENT BASE EX CREA: 1.0000 "application " | TOPICAL_CREAM | Freq: Two times a day (BID) | CUTANEOUS | 1 refills | Status: DC

## 2017-06-02 MED ORDER — RADIAPLEXRX EX GEL: 1.0000 "application " | Freq: Two times a day (BID) | CUTANEOUS | 1 refills | Status: DC

## 2017-06-02 MED ORDER — ANASTROZOLE 1 MG PO TABS: 1.0000 mg | ORAL_TABLET | Freq: Every day | ORAL | 4 refills | Status: DC

## 2017-06-02 NOTE — Progress Notes (Signed)
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   She endorsed intermittent fatigue throughout treatment.  Plan: The patient has completed radiation treatment. The patient will return to radiation oncology clinic for routine followup in one month. I advised them to call or return sooner if they have any questions or concerns related to their recovery or treatment.  ------------------------------------------------  Jodelle Gross, MD, PhD  This document serves as a record of services personally performed by Kyung Rudd, MD. It was created on his behalf by Linward Natal, a trained medical scribe. The creation of this record is based on the scribe's personal observations and the provider's statements to them. This document has been checked and approved by the attending provider.

## 2017-06-02 NOTE — Progress Notes (Signed)
Jessica Ingram  [phone]) [phone] [phone]) [phone]     ID: Jessica Ingram DOB: 1951-09-03  MR#: [id number]  YPP#:[id number]  Patient Care Team: Marda Stalker, PA-C as PCP - General (Family Medicine) Fanny Skates, MD as Consulting Physician (General Surgery) , Virgie Dad, MD as Consulting Physician (Oncology) Kyung Rudd, MD as Consulting Physician (Radiation Oncology) Juanita Craver, MD as Consulting Physician (Gastroenterology) Sydnee Levans, MD as Consulting Physician (Dermatology) Azucena Fallen, MD as Consulting Physician (Obstetrics and Gynecology) Elsie Saas, MD as Consulting Physician (Orthopedic Surgery) Chauncey Cruel, MD OTHER MD:  CHIEF COMPLAINT: Estrogen receptor positive breast cancer  CURRENT TREATMENT: To start anastrozole September 2018   BREAST CANCER HISTORY: From the original intake note:  "Jessica Ingram" had bilateral screening mammography with tomography at [facility] 01/01/2017. The breast density was category D. In the right breast superiorly there was a 1.1 cm irregular mass. In the left breast there was an area of possible architectural distortion.  The patient was recalled 01/07/2017 or bilateral diagnostic mammography and bilateral ultrasonography. In the right breast superiorly there was a 1.5 cm irregular mass which was located by sonography in the upper inner quadrant area this measured 1.1 cm.  In the left breast the area of architectural distortion was again noted. By ultrasound this was an irregular mass with indistinct margins in the upper inner quadrant of the left breast.  Biopsy of both the areas in question was performed 01/11/2017. 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 The left  breast biopsy showed only fibrocystic changes but this was felt to be discordant by radiology  The patient's subsequent history is as detailed below  INTERVAL HISTORY: Jessica Ingram returns today for follow-up and treatment of her bilateral breast cancers accompanied by her sister Jessica Ingram. Since her last visit here she underwent bilateral lumpectomies and right-sided sentinel lymph node sampling 03/05/2017. The final pathology ([id number]) showed, on the right, a 1.4 cm invasive ductal carcinoma, grade 1, with negative margins. All 4 sentinel lymph nodes were clear. On the left there was a 1.2 cm invasive ductal carcinoma, also grade 1, and this tumor was estrogen receptor 90% positive, with strong staining intensity, but but progesterone receptor negative. MIB-1 was 5%. There was no HER-2 implication with a signals ratio of 1.14, and an number per cell of 1.60.  On 03/29/2017 the patient underwent left sentinel lymph node sampling with all 3 sentinel lymph nodes clear.  Jessica Ingram then proceeded to adjuvant radiation which was completed 05/26/2017. She is here today to discuss anti-estrogens.  REVIEW OF SYSTEMS: She did remarkably well with her surgery and also with her radiation. Her skin is still red and itchy however and she would like to get refills on her supportive creams. She had mild fatigue but she is training for a hiking trip up [place] in September. A detailed review of systems today was otherwise noncontributory  PAST MEDICAL HISTORY: Past Medical History:  Diagnosis Date  . Arthritis   . Genetic testing 02/19/2017   Ms. Nash underwent genetic counseling and testing for hereditary cancer syndromes on 02/02/2017. Her results were negative for mutations in all 46 genes analyzed by [facility]'s 46-gene Common Hereditary Cancers Panel. Genes analyzed include: APC, ATM, AXIN2, BARD1, BMPR1A, BRCA1, BRCA2, BRIP1, CDH1, CDKN2A, CHEK2, CTNNA1, DICER1, EPCAM, GREM1,  HOXB13, KIT, MEN1, MLH1, MSH2, MSH3,  MSH6, MUTYH, NBN,  . Hepatitis    at age 24    PAST SURGICAL HISTORY: Past Surgical History:  Procedure Laterality Date  . ANTERIOR CRUCIATE LIGAMENT REPAIR Left   . AXILLARY SENTINEL NODE BIOPSY Left 03/29/2017   Procedure: LEFT AXILLARY SENTINEL LYMPH NODE BIOPSY, INJECT BLUE DYE LEFT BREAST;  Surgeon: Fanny Skates, MD;  Location: [place];  Service: General;  Laterality: Left;  . BREAST LUMPECTOMY WITH RADIOACTIVE SEED AND SENTINEL LYMPH NODE BIOPSY Bilateral 03/05/2017   Procedure: BILATERAL BREAST LUMPECTOMY WITH RADIOACTIVE SEED AND RIGHT AXILLARY SENTINEL LYMPH NODE BIOPSY;  Surgeon: Fanny Skates, MD;  Location: [place];  Service: General;  Laterality: Bilateral;  . FINE NEEDLE ASPIRATION Right 03/29/2017   Procedure: ASPIRATION OF SEROMA RIGHT AXILLA;  Surgeon: Fanny Skates, MD;  Location: [place];  Service: General;  Laterality: Right;    FAMILY HISTORY Family History  Problem Relation Age of Onset  . Lung cancer Mother 48       d.93 history of smoking  . Prostate cancer Father 34       d.83 prostate cancer metastasized to bone  . Colon cancer Brother 71       d.53  . Breast cancer Maternal Aunt 73       d.85s  . Colon cancer Maternal Aunt 83  . Bone cancer Paternal Aunt        d.85  . Prostate cancer Paternal Uncle 32       d.72s metastasized to bladder  . Kidney cancer Maternal Grandfather 47       d.53 possible colon cancer  . Lung cancer Paternal Uncle 10       d.60  . Brain cancer Cousin 4       d.11 paternal first-cousin. Son of uncle with prostate cancer.  . Prostate cancer Paternal Grandfather        d.89  The patient's father was diagnosed with prostate cancer at the age of 63. He died from unrelated causes at age 22. The patient's mother was diagnosed with lung cancer at age 19. She was a smoker. She died from unrelated causes at age 10. The patient has one brother, 5 sisters. The brother was  diagnosed with colon cancer at age 58 and died at age 93. There is also a maternal aunt diagnosed with both breast and colon cancers in her 41s. There is no history of ovarian cancer in the family  GYNECOLOGIC HISTORY:  No LMP recorded. Patient is postmenopausal. Menarche age 45, first live birth age 23, which increases the risk of breast cancer developing. The patient is GX P2. She went through the change of life age 19 and was on hormone replacement until age 58. She also took oral contraceptives remotely for about 12 years with no complications  SOCIAL HISTORY:  Jessica Ingram is widowed, her husband dying from complications of depression in September 2017. 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 The patient has no grandchildren. She is not a church attender     ADVANCED DIRECTIVES:  in place; the patient has named her sister Jessica Ingram as healthcare power of attorney. Jessica Ingram  may be reached at [phone]    HEALTH MAINTENANCE: Social History  Substance Use Topics  . Smoking status: Never Smoker  . Smokeless tobacco: Never Used  . Alcohol use Yes     Comment: 7     Colonoscopy: January 2017/man  PAP: 2014  Bone density: 2014? Went over OB/GYN   No Known Allergies  Current Outpatient Prescriptions  Medication Sig Dispense Refill  . anastrozole (ARIMIDEX) 1 MG tablet Take 1 tablet (1 mg total) by mouth daily. 90 tablet 4  . emollient (BIAFINE) cream Apply 1 application topically 2 (two) times daily. For itchyness on b/l breasts 454 g 1  . hyaluronate sodium (RADIAPLEXRX) GEL Apply 1 application topically 2 (two) times daily. 1 Tube 1  . non-metallic deodorant (ALRA) MISC Apply 1 application topically  daily.     No current facility-administered medications for this visit.     OBJECTIVE: Middle-aged white woman In no acute distress  Vitals:   06/02/17 0940  BP: 128/77  Pulse: 69  Resp: 18  Temp: (!) 97.5 F (36.4 C)     Body mass index is 21.64 kg/m.    ECOG FS:1 - Symptomatic but completely ambulatory  Sclerae unicteric, pupils round and equal Oropharynx clear and moist No cervical or supraclavicular adenopathy Lungs no rales or rhonchi Heart regular rate and rhythm Abd soft, nontender, positive bowel sounds MSK no focal spinal tenderness, no upper extremity lymphedema Neuro: nonfocal, well oriented, appropriate affect Breasts: Both breasts are status post lumpectomy and radiation. The cosmetic result is excellent. There is residual erythema and some skin irritation, but overall the incisions have healed very nicely. There is no evidence of residual or recurrent disease. Both axillae are benign.    LAB RESULTS:  CMP     Component Value Date/Time   NA 140 01/20/2017 1249   K 3.9 01/20/2017 1249   CO2 26 01/20/2017 1249   GLUCOSE 138 01/20/2017 1249   BUN 12.9 01/20/2017 1249   CREATININE 1.0 01/20/2017 1249   CALCIUM 9.9 01/20/2017 1249   PROT 7.9 01/20/2017 1249   ALBUMIN 4.7 01/20/2017 1249   AST 20 01/20/2017 1249   ALT 20 01/20/2017 1249   ALKPHOS 58 01/20/2017 1249   BILITOT 0.58 01/20/2017 1249    No results found for: TOTALPROTELP, ALBUMINELP, A1GS, A2GS, BETS, BETA2SER, GAMS, MSPIKE, SPEI  No results found for: Nils Pyle, [facility]  Lab Results  Component Value Date   WBC 4.6 01/20/2017   NEUTROABS 2.7 01/20/2017   HGB 14.1 01/20/2017   HCT 40.7 01/20/2017   MCV 95.5 01/20/2017   PLT 183 01/20/2017      Chemistry      Component Value Date/Time   NA 140 01/20/2017 1249   K 3.9 01/20/2017 1249   CO2 26 01/20/2017 1249   BUN 12.9 01/20/2017 1249   CREATININE 1.0 01/20/2017 1249      Component Value Date/Time   CALCIUM  9.9 01/20/2017 1249   ALKPHOS 58 01/20/2017 1249   AST 20 01/20/2017 1249   ALT 20 01/20/2017 1249   BILITOT 0.58 01/20/2017 1249       No results found for: LABCA2  No components found for: [id number]  No results for input(s): INR in the last 168 hours.  Urinalysis No results found for: COLORURINE, APPEARANCEUR, LABSPEC, PHURINE, GLUCOSEU, HGBUR, BILIRUBINUR, KETONESUR, PROTEINUR, UROBILINOGEN, NITRITE, LEUKOCYTESUR   STUDIES: We are trying to obtain results from the bone density scan obtained by her gynecologist in 2014  ELIGIBLE FOR AVAILABLE  RESEARCH PROTOCOL: no  ASSESSMENT: 66 y.o. [place] woman status post right breast upper inner quadrant biopsy 01/11/2017 for a clinical  T1c N0, stage IA invasive ductal carcinoma, grade 1, estrogen and progesterone receptor positive, HER-2 nonamplified, with an MIB-1 of 5%.   (1) Left lumpectomy and sentinel lymph node sampling 03/05/2017 showed a pT1c pN0 invasive ductal carcinoma, grade 1, with close but negative margins, estrogen receptor strongly positive, progesterone receptor negative, with an MIB-1 of 5% and no HER-2 amplification.  (a) left axillary sentinel lymph node biopsy 03/29/2017 showed no lymph node involvement  (2) right lumpectomy 03/05/2017 showed a pT1c pN0 invasive ductal carcinoma, grade 1, with negative margins  (3) Oncotype DX scores of 19-21 predicts a 10 year risk of recurrence outside the breast of 12-13% if the patient's only systemic therapy is tamoxifen for 5 years. It also predicts no benefit from chemotherapy.  (4) adjuvant radiation completed 05/26/2017  (5)  To start anastrozole 07/15/2017  (a) [facility]  (6) genetics testing performed through [facility]'s Common Hereditary Cancers Panel reported out on 02/15/2017 showed no deleterious mutations in APC, ATM, AXIN2, BARD1, BMPR1A, BRCA1, BRCA2, BRIP1, CDH1, CDKN2A, CHEK2, CTNNA1, DICER1, EPCAM, GREM1, HOXB13, KIT,  MEN1, MLH1, MSH2, MSH3, MSH6, MUTYH, NBN, NF1, NTHL1, PALB2, PDGFRA, PMS2, POLD1, POLE, PTEN, RAD50, RAD51C, RAD51D, SDHA, SDHB, SDHC, SDHD, SMAD4, SMARCA4, STK11, TP53, TSC1, TSC2, and VHL.   (a) Variants of uncertain significance (VUSs) were identified in three genes.    DICER1 c.2027G>C (p.Arg676Pro)   TSC2 c.4325A>T (p.Glu1442Val)   CTNNA1 c.410G>A (p.Arg137Gln)  PLAN: Jessica Ingram has completed her local treatment. She is recovering very nicely except that she still has some irritation of the skin and I have refilled her emollients at her request.  She is now ready to start anti-estrogens and we discussed the difference between tamoxifen and anastrozole in detail. She understands that anastrozole and the aromatase inhibitors in general work by blocking estrogen production. Accordingly vaginal dryness, decrease in bone density, and of course hot flashes can result. The aromatase inhibitors can also negatively affect the cholesterol profile, although that is a minor effect. One out of 5 women on aromatase inhibitors we will feel "old and achy". This arthralgia/myalgia syndrome, which resembles fibromyalgia clinically, does resolve with stopping the medications. Accordingly this is not a reason to not try an aromatase inhibitor but it is a frequent reason to stop it (in other words 20% of women will not be able to tolerate these medications).  Tamoxifen on the other hand does not block estrogen production. It does not "take away a woman's estrogen". It blocks the estrogen receptor in breast cells. Like anastrozole, it can also cause hot flashes. As opposed to anastrozole, tamoxifen has many estrogen-like effects. It is technically an estrogen receptor modulator. This means that in some tissues tamoxifen works like estrogen-- for example it helps strengthen the bones. It tends to improve the cholesterol profile. It can cause thickening of the endometrial lining, and even endometrial polyps or rarely cancer of  the uterus.(The risk of uterine cancer due to tamoxifen is one additional cancer per thousand women year). It can cause vaginal wetness or stickiness. It can cause blood clots through this estrogen-like effect--the risk of blood clots with tamoxifen is exactly the same as with birth control pills or hormone replacement.  Neither of these agents causes mood changes or weight gain, despite the popular belief that they can have these side effects. We have data from studies  comparing either of these drugs with placebo, and in those cases the control group had the same amount of weight gain and depression as the group that took the drug.  With this information she had no difficulty choosing anastrozole as the drug she would like to start with. She has a trip to [place] with HER-2 daughters planned for early September and she really does not want to start before then. She will start approximately September 20.  Accordingly she will see me again early December. If she is tolerating the pill well the plan will be for anastrozole for a minimum of 5 years maximum of 7.  I have set her up for a bone density scan at the [facility] late November and we will have those results by the time she sees me again  Jessica Ingram knows to call for any other issues that may develop before the next visit here.   Chauncey Cruel, MD   06/02/2017 10:19 AM Medical Oncology and Hematology [facility] [address] [place], [place] [address] Tel. [phone]    Fax. [phone]

## 2017-06-02 NOTE — Telephone Encounter (Signed)
Per pt request scripts faxed to [facility] on [address] and [address]

## 2017-07-12 ENCOUNTER — Ambulatory Visit: Payer: Self-pay | Admitting: Radiation Oncology

## 2017-07-14 ENCOUNTER — Encounter: Payer: Self-pay | Admitting: Radiation Oncology

## 2017-07-14 ENCOUNTER — Ambulatory Visit
Admission: RE | Admit: 2017-07-14 | Discharge: 2017-07-14 | Disposition: A | Discharge status: Home / Self Care | Payer: BLUE CROSS/BLUE SHIELD | Source: Ambulatory Visit | Attending: Radiation Oncology | Admitting: Radiation Oncology

## 2017-07-14 VITALS — BP 135/90 | HR 72 | Temp 98.0°F | Resp 18 | Ht 66.5 in | Wt 134.2 lb

## 2017-07-14 DIAGNOSIS — Z170 Estrogen receptor positive status [ER+]: Secondary | ICD-10-CM | POA: Insufficient documentation

## 2017-07-14 DIAGNOSIS — C50212 Malignant neoplasm of upper-inner quadrant of left female breast: Secondary | ICD-10-CM

## 2017-07-14 DIAGNOSIS — C50911 Malignant neoplasm of unspecified site of right female breast: Secondary | ICD-10-CM | POA: Diagnosis present

## 2017-07-14 DIAGNOSIS — C50211 Malignant neoplasm of upper-inner quadrant of right female breast: Secondary | ICD-10-CM

## 2017-07-14 NOTE — Progress Notes (Signed)
  Radiation Oncology         [phone] ________________________________  Name: Jessica Ingram MRN: [id number]  Date of Service: 07/14/2017  DOB: [birth year]  Post Treatment Note  CC: Marda Stalker, PA-C  Magrinat, Virgie Dad, MD  Diagnosis:   Stage IA, pT1cN0Mx, ER/PR positive, grade 1 invasive and in situ ductal carcinoma of the right breast with Stage IA, pT1cN0Mx ER/PR positive grade 1 invasive ductal carcinoma of the left breast    Interval Since Last Radiation:  7 weeks   04/29/17 - 05/26/17: Right breast treated to 42.5 Gy with 17 fx of 2.5 Gy and a boost of 7.5 Gy with 3 fx of 2.5 Gy   Narrative:  The patient returns today for routine follow-up. During treatment she did very well with radiotherapy and did not have significant desquamation.                             On review of systems, the patient states she is doing well overall. She states that she is not experiencing any concerns with her skin at this time, she did have some mild hyperpigmentation that this is improving. She is been using sunscreen she recently was on a trip being outside during the majority of that time. She denies any edema in the upper extremities.  ALLERGIES:  has No Known Allergies.  Meds: Current Outpatient Prescriptions  Medication Sig Dispense Refill  . anastrozole (ARIMIDEX) 1 MG tablet Take 1 tablet (1 mg total) by mouth daily. 90 tablet 4   No current facility-administered medications for this encounter.     Physical Findings:  height is 5' 6.5" (1.689 m) and weight is 134 lb 3.2 oz (60.9 kg). Her oral temperature is 98 F (36.7 C). Her blood pressure is 135/90 and her pulse is 72. Her respiration is 18 and oxygen saturation is 100%.  Pain Assessment Pain Score: 0-No pain/10 In general this is a well appearing caucasian female in no acute distress. She's alert and oriented x4 and appropriate throughout the examination. Cardiopulmonary assessment is negative for acute distress and she  exhibits normal effort. Bilateral breasts were examined and revealed minimal hyperpigmentation without evidence of desquamation. Her lumpectomy and axillary incisions are healing well without evidence of chest wall or upper extremity edema.  Lab Findings: Lab Results  Component Value Date   WBC 4.6 01/20/2017   HGB 14.1 01/20/2017   HCT 40.7 01/20/2017   MCV 95.5 01/20/2017   PLT 183 01/20/2017     Radiographic Findings: No results found.  Impression/Plan: 1. 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 The patient has been doing well since completion of radiotherapy. We discussed that we would be happy to continue to follow her as needed, but she will also continue to follow up with Dr. Jana Hakim in medical oncology. She was counseled on skin care as well as measures to avoid sun exposure to this area.  2. Survivorship. The patient will meet with survivorship clinic this fall,a nd we discussed the rationale for this visit.    Carola Rhine, PAC

## 2017-07-14 NOTE — Addendum Note (Signed)
Encounter addended by: Malena Edman, RN on: 07/14/2017 12:42 PM<BR>    Actions taken: Charge Capture section accepted

## 2017-09-10 ENCOUNTER — Telehealth: Payer: Self-pay

## 2017-09-10 NOTE — Telephone Encounter (Signed)
Spoke with pt regarding SCP visit on 11/27 @ 11 am.  Pt stated she will come to appt.

## 2017-09-14 ENCOUNTER — Encounter: Payer: Self-pay | Admitting: Oncology

## 2017-09-14 NOTE — Progress Notes (Signed)
Oncotype requested office notes, faxed to [phone], confirmation received.

## 2017-09-20 ENCOUNTER — Other Ambulatory Visit: Payer: Self-pay | Admitting: *Deleted

## 2017-09-20 ENCOUNTER — Encounter: Payer: BLUE CROSS/BLUE SHIELD | Admitting: Adult Health

## 2017-09-20 DIAGNOSIS — C50211 Malignant neoplasm of upper-inner quadrant of right female breast: Secondary | ICD-10-CM

## 2017-09-20 DIAGNOSIS — Z170 Estrogen receptor positive status [ER+]: Secondary | ICD-10-CM

## 2017-09-21 ENCOUNTER — Other Ambulatory Visit (HOSPITAL_BASED_OUTPATIENT_CLINIC_OR_DEPARTMENT_OTHER): Payer: BLUE CROSS/BLUE SHIELD

## 2017-09-21 ENCOUNTER — Telehealth: Payer: Self-pay | Admitting: Adult Health

## 2017-09-21 ENCOUNTER — Ambulatory Visit (HOSPITAL_BASED_OUTPATIENT_CLINIC_OR_DEPARTMENT_OTHER): Payer: BLUE CROSS/BLUE SHIELD | Admitting: Adult Health

## 2017-09-21 VITALS — BP 125/86 | HR 65 | Temp 98.3°F | Resp 17 | Ht 66.5 in | Wt 134.5 lb

## 2017-09-21 DIAGNOSIS — C50212 Malignant neoplasm of upper-inner quadrant of left female breast: Secondary | ICD-10-CM

## 2017-09-21 DIAGNOSIS — C50211 Malignant neoplasm of upper-inner quadrant of right female breast: Secondary | ICD-10-CM | POA: Diagnosis not present

## 2017-09-21 DIAGNOSIS — Z170 Estrogen receptor positive status [ER+]: Secondary | ICD-10-CM | POA: Diagnosis not present

## 2017-09-21 LAB — DRAW EXTRA CLOT TUBE

## 2017-09-21 LAB — CBC WITH DIFFERENTIAL/PLATELET
BASO%: 0.3 % (ref 0.0–2.0)
Basophils Absolute: 0 10*3/uL (ref 0.0–0.1)
EOS%: 0.9 % (ref 0.0–7.0)
Eosinophils Absolute: 0 10*3/uL (ref 0.0–0.5)
HCT: 41.8 % (ref 34.8–46.6)
HGB: 14 g/dL (ref 11.6–15.9)
LYMPH%: 16.7 % (ref 14.0–49.7)
MCH: 32.6 pg (ref 25.1–34.0)
MCHC: 33.4 g/dL (ref 31.5–36.0)
MCV: 97.6 fL (ref 79.5–101.0)
MONO#: 0.5 10*3/uL (ref 0.1–0.9)
MONO%: 9.9 % (ref 0.0–14.0)
NEUT#: 3.3 10*3/uL (ref 1.5–6.5)
NEUT%: 72.2 % (ref 38.4–76.8)
Platelets: 180 10*3/uL (ref 145–400)
RBC: 4.29 10*6/uL (ref 3.70–5.45)
RDW: 12.4 % (ref 11.2–14.5)
WBC: 4.6 10*3/uL (ref 3.9–10.3)
lymph#: 0.8 10*3/uL — ABNORMAL LOW (ref 0.9–3.3)

## 2017-09-21 LAB — COMPREHENSIVE METABOLIC PANEL
ALT: 21 U/L (ref 0–55)
AST: 22 U/L (ref 5–34)
Albumin: 4.1 g/dL (ref 3.5–5.0)
Alkaline Phosphatase: 81 U/L (ref 40–150)
Anion Gap: 10 mEq/L (ref 3–11)
BUN: 12.6 mg/dL (ref 7.0–26.0)
CO2: 25 mEq/L (ref 22–29)
Calcium: 9.7 mg/dL (ref 8.4–10.4)
Chloride: 102 mEq/L (ref 98–109)
Creatinine: 0.8 mg/dL (ref 0.6–1.1)
EGFR: >60 mL/min/{1.73_m2} (ref >60)
Glucose: 101 mg/dl (ref 70–140)
Potassium: 4.4 mEq/L (ref 3.5–5.1)
Sodium: 137 mEq/L (ref 136–145)
Total Bilirubin: 0.45 mg/dL (ref 0.20–1.20)
Total Protein: 7.8 g/dL (ref 6.4–8.3)

## 2017-09-21 NOTE — Progress Notes (Signed)
CLINIC:  Survivorship   REASON FOR VISIT:  Routine follow-up post-treatment for a recent history of breast cancer.  BRIEF ONCOLOGIC HISTORY:    Malignant neoplasm of upper-inner quadrant of right breast in female, estrogen receptor positive ([place])   01/11/2017 Initial Biopsy    Right breast upper inner quadrant biopsy: IDC, grade 1, negative margins, ER+, PR+, Her-2neg, Ki-67 5%.        01/15/2017 Initial Diagnosis    Malignant neoplasm of upper-inner quadrant of right breast in female, estrogen receptor positive ([place])      02/15/2017 Genetic Testing    Genetic counseling and testing for hereditary cancer syndromes performed on 02/02/2017. Results are negative for pathogenic mutations in 46 genes analyzed by [facility]'s Common Hereditary Cancers Panel. Results are dated 02/15/2017. Genes tested: APC, ATM, AXIN2, BARD1, BMPR1A, BRCA1, BRCA2, BRIP1, CDH1, CDKN2A, CHEK2, CTNNA1, DICER1, EPCAM, GREM1, HOXB13, KIT, MEN1, MLH1, MSH2, MSH3, MSH6, MUTYH, NBN, NF1, NTHL1, PALB2, PDGFRA, PMS2, POLD1, POLE, PTEN, RAD50, RAD51C, RAD51D, SDHA, SDHB, SDHC, SDHD, SMAD4, SMARCA4, STK11, TP53, TSC1, TSC2, VHL.  Variants of uncertain significance (not clinically actionable) were noted in CTNNA1, DICER1, and TSC2.         03/05/2017 Surgery    Left Lumpectomy: IDC, grade 1, 1.2cm, ADH, lobular hyperplasia, T1c, Nx, ER+ (90%), PR- (0%), Ki-67 5%, HER-2 negative (ratio 1.14). Right Lumpectomy: IDC, grade 1, 1.4 cm, DCIS, LCIS, 4SLN neg, T1c, N0      03/05/2017 Oncotype testing    19-21/12-13%, intermediate risk      03/29/2017 Surgery    3 left axillary SLN negative for carcinoma      04/29/2017 - 05/26/2017 Radiation Therapy    Right breast treated to 42.5 Gy with 17 fx of 2.5 Gy and a boost of 7.5 Gy with 3 fx of 2.5 Gy  Left breast treated to 42.5 Gy with 17 fx of 2.5 Gy and a boost of 7.5 Gy with 3 fx of 2.5 Gy        06/2017 -  Anti-estrogen oral therapy    Anastrozole daily       Malignant  neoplasm of upper-inner quadrant of left breast in female, estrogen receptor positive ([place])   04/15/2017 Initial Diagnosis    Malignant 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     Overall, Jessica Ingram reports feeling quite well.  She is taking Anastrozole daily and is tolerating it moderately well.      REVIEW OF SYSTEMS:  Review of Systems  Constitutional: Negative for appetite change, chills, fatigue and fever.  HENT:   Negative for hearing loss and lump/mass.   Eyes: Negative for eye problems and icterus.  Respiratory: Negative for chest tightness, cough and shortness of breath.   Cardiovascular: Negative for chest pain, leg swelling and palpitations.  Gastrointestinal: Negative for abdominal distention, abdominal pain, constipation, diarrhea, nausea and vomiting.  Endocrine: Positive for hot flashes.  Skin: Negative for itching and rash.  Neurological: Negative for dizziness, extremity weakness, headaches and numbness.  Hematological: Negative for adenopathy. Does not bruise/bleed easily.  Psychiatric/Behavioral: Negative for depression. The patient is not nervous/anxious.    Breast: Denies any  new nodularity, masses, tenderness, nipple changes, or nipple discharge.      ONCOLOGY TREATMENT TEAM:  1. Surgeon:  Dr. Dalbert Batman at [facility] 2. Medical Oncologist: Dr. Jana Hakim  3. Radiation Oncologist: Dr. Lisbeth Renshaw    PAST MEDICAL/SURGICAL HISTORY:  Past Medical History:  Diagnosis Date  . Arthritis   . Genetic testing 02/19/2017   Jessica Ingram underwent genetic counseling and  testing for hereditary cancer syndromes on 02/02/2017. Her results were negative for mutations in all 46 genes analyzed by [facility]'s 46-gene Common Hereditary Cancers Panel. Genes analyzed include: APC, ATM, AXIN2, BARD1, BMPR1A, BRCA1, BRCA2, BRIP1, CDH1, CDKN2A, CHEK2, CTNNA1, DICER1, EPCAM, GREM1, HOXB13, KIT, MEN1, MLH1, MSH2, MSH3, MSH6, MUTYH, NBN,  . Hepatitis    at age 8   Past Surgical History:  Procedure Laterality Date  . ANTERIOR CRUCIATE LIGAMENT REPAIR Left   . AXILLARY SENTINEL NODE BIOPSY Left 03/29/2017   Procedure: LEFT AXILLARY SENTINEL LYMPH NODE BIOPSY, INJECT BLUE DYE LEFT BREAST;  Surgeon: Fanny Skates, MD;  Location: [facility];  Service: General;  Laterality: Left;  . BREAST LUMPECTOMY WITH RADIOACTIVE SEED AND SENTINEL LYMPH NODE BIOPSY Bilateral 03/05/2017   Procedure: BILATERAL BREAST LUMPECTOMY WITH RADIOACTIVE SEED AND RIGHT AXILLARY SENTINEL LYMPH NODE BIOPSY;  Surgeon: Fanny Skates, MD;  Location: [facility];  Service: General;  Laterality: Bilateral;  . FINE NEEDLE ASPIRATION Right 03/29/2017   Procedure: ASPIRATION OF SEROMA RIGHT AXILLA;  Surgeon: Fanny Skates, MD;  Location: [facility];  Service: General;  Laterality: Right;     ALLERGIES:  No Known Allergies   CURRENT MEDICATIONS:  Outpatient Encounter Medications as of 09/21/2017  Medication Sig  . anastrozole (ARIMIDEX) 1 MG tablet Take 1 tablet (1 mg total) by mouth daily.   No facility-administered encounter medications on file as of 09/21/2017.      ONCOLOGIC FAMILY HISTORY:  Family History  Problem Relation Age of Onset  . Lung cancer Mother 74       d.93 history of smoking  . Prostate cancer Father 63       d.83 prostate cancer metastasized to bone  . Colon cancer Brother 76       d.53  . Breast cancer Maternal Aunt 73       d.85s  . Colon cancer Maternal Aunt 83  . Bone cancer Paternal Aunt        d.85  . Prostate cancer Paternal  Uncle 52       d.72s metastasized to bladder  . Kidney cancer Maternal Grandfather 55       d.53 possible colon cancer  . Lung cancer Paternal Uncle 8       d.60  . Brain cancer Cousin 20       d.11 paternal first-cousin. Son of uncle with prostate cancer.  . Prostate cancer Paternal Grandfather        d.89     GENETIC COUNSELING/TESTING: negative  SOCIAL HISTORY:  Jessica Ingram is widowed and lives alone in [place], [place].  She has 2 children and they live in [place] and [place], [place].  Jessica Ingram is currently working part time at [employer].  She denies any current or history of tobacco, alcohol, or illicit drug use.     PHYSICAL EXAMINATION:  Vital Signs:   Vitals:   09/21/17 1112  BP: 125/86  Pulse: 65  Resp: 17  Temp: 98.3 F (36.8 C)  SpO2: 100%   Filed Weights  09/21/17 1112  Weight: 134 lb 8 oz (61 kg)   General: Well-nourished, well-appearing female in no acute distress.  She is unaccompanied today.   HEENT: Head is normocephalic.  Pupils equal and reactive to light. Conjunctivae clear without exudate.  Sclerae anicteric. Oral mucosa is pink, moist.  Oropharynx is pink without lesions or erythema.  Lymph: No cervical, supraclavicular, or infraclavicular lymphadenopathy noted on palpation.  Cardiovascular: Regular rate and rhythm.Marland Kitchen Respiratory: Clear to auscultation bilaterally. Chest expansion symmetric; breathing non-labored.  GI: Abdomen soft and round; non-tender, non-distended. Bowel sounds normoactive.  GU: Deferred.  Neuro: No focal deficits. Steady gait.  Psych: Mood and affect normal and appropriate for situation.  Extremities: No edema. MSK: No focal spinal tenderness to palpation.  Full range of motion in bilateral upper extremities Skin: Warm and dry.  LABORATORY DATA:  None for this visit.  DIAGNOSTIC IMAGING:  None for this visit.      ASSESSMENT AND PLAN:  Jessica Ingram is a pleasant 66 y.o. female with  Stage IA right breast invasive ductal carcinoma, ER+/PR+/HER2-, diagnosed in 12/2016, treated with lumpectomy, adjuvant radiation therapy, and anti-estrogen therapy with Anastrozole beginning in 06/2017.  She presents to the Survivorship Clinic for our initial meeting and routine follow-up post-completion of treatment for breast cancer.    1. Stage IA right breast cancer:  Jessica Ingram is continuing to recover from definitive treatment for breast cancer. She will follow-up with her medical oncologist, Dr. Jana Hakim in 11/2017 with history and physical exam per surveillance protocol.  She will continue her anti-estrogen therapy with Anastrozole. Thus far, she is tolerating the Anastrozole well, with minimal side effects. Today, a comprehensive survivorship care plan and treatment summary was reviewed with the patient today detailing her breast cancer diagnosis, treatment course, potential late/long-term effects of treatment, appropriate follow-up care with recommendations for the future, and patient education resources.  A copy of this summary, along with a letter will be sent to the patient's primary care provider via mail/fax/In Basket message after today's visit.    2. Bone health:  Given Jessica Ingram age/history of breast cancer and her current treatment regimen including anti-estrogen therapy with Anastrozole, she is at risk for bone demineralization.  She will undergo DEXA tomorrow.  We discussed what may be indicated with those results.  In the meantime, she was encouraged to increase her consumption of foods rich in calcium, as well as increase her weight-bearing activities.  She was given education on specific activities to promote bone health.  3. Cancer screening:  Due to Jessica Ingram history and her age, she should receive screening for skin cancers, colon cancer, and gynecologic cancers.  The information and recommendations are listed on the patient's comprehensive care plan/treatment summary and were  reviewed in detail with the patient.    4. Health maintenance and wellness promotion: Jessica Ingram was encouraged to consume 5-7 servings of fruits and vegetables per day. We reviewed the "Nutrition Rainbow" handout, as well as the handout "Take Control of Your Health and Reduce Your Cancer Risk" from the [facility].  She was also encouraged to engage in moderate to vigorous exercise for 30 minutes per day most days of the week. We discussed the [employer] fitness program, which is designed for cancer survivors to help them become more physically fit after cancer treatments.  She was instructed to limit her alcohol consumption and continue to abstain from tobacco use.     5. Support services/counseling: It is not uncommon for this  period of the patient's cancer care trajectory to be one of many emotions and stressors.  We discussed an opportunity for her to participate in the next session of [facility] ("Finding Your New Normal") support group series designed for patients after they have completed treatment.   Jessica Ingram was encouraged to take advantage of our many other support services programs, support groups, and/or counseling in coping with her new life as a cancer survivor after completing anti-cancer treatment.  She was offered support today through active listening and expressive supportive counseling.  She was given information regarding our available services and encouraged to contact me with any questions or for help enrolling in any of our support group/programs.    Dispo:   -Return to cancer center for follow up with Dr. Jana Hakim in 11/2017  -Mammogram due in 12/2017  -She is welcome to return back to the Survivorship Clinic at any time; no additional follow-up needed at this time.  -Consider referral back to survivorship as a long-term survivor for continued surveillance  A total of (30) minutes of face-to-face time was spent with this patient with greater than 50% of that time in  counseling and care-coordination.   Jessica Ingram, [place] [phone]   Note: PRIMARY CARE PROVIDER Marda Stalker, [place] [phone]

## 2017-09-21 NOTE — Telephone Encounter (Signed)
Gave patient AVS and calendar of upcoming february appointments

## 2017-09-22 ENCOUNTER — Encounter: Payer: Self-pay | Admitting: Adult Health

## 2017-09-22 ENCOUNTER — Ambulatory Visit
Admission: RE | Admit: 2017-09-22 | Discharge: 2017-09-22 | Disposition: A | Discharge status: Home / Self Care | Payer: BLUE CROSS/BLUE SHIELD | Source: Ambulatory Visit | Attending: Oncology | Admitting: Oncology

## 2017-09-22 DIAGNOSIS — Z170 Estrogen receptor positive status [ER+]: Secondary | ICD-10-CM

## 2017-09-22 DIAGNOSIS — C50211 Malignant neoplasm of upper-inner quadrant of right female breast: Secondary | ICD-10-CM

## 2017-09-22 DIAGNOSIS — C50212 Malignant neoplasm of upper-inner quadrant of left female breast: Secondary | ICD-10-CM

## 2017-09-23 ENCOUNTER — Telehealth: Payer: Self-pay

## 2017-09-23 NOTE — Telephone Encounter (Signed)
Called patient to let her know that bone density results are normal.  She thanked LPN for call.  No questions or concerns voiced at this time.

## 2017-09-28 ENCOUNTER — Ambulatory Visit: Payer: BLUE CROSS/BLUE SHIELD | Admitting: Oncology

## 2017-11-30 ENCOUNTER — Ambulatory Visit: Payer: BLUE CROSS/BLUE SHIELD | Admitting: Oncology

## 2018-01-29 IMAGING — MR MR BILATERAL BREAST WITHOUT AND WITH CONTRAST
6 of 14 series · 18 of 48 positions shown · IV contrast (Yes)
Comparison: 01/18/2017 from [REDACTED]

CLINICAL DATA: New diagnosis of invasive ductal carcinoma, grade 1
in the right breast. Tumor is ER 100% positive. Ultrasound-guided
core biopsy of a mass in the 11 o'clock location of the left breast
demonstrated benign fibrocystic changes and benign breast
parenchyma. Pathology results are felt to be discordant with imaging
findings and re-biopsy has been scheduled for 1-2 weeks following
resolution of hematoma at the biopsy site.

LABS:  None applicable
EXAM:
BILATERAL BREAST MRI WITH AND WITHOUT CONTRAST
TECHNIQUE: Multiplanar, multisequence MR images of both breasts were obtained
prior to and following the intravenous administration of 12 ml of
MultiHance

[Series 1: acess#(phone_number) · axial · 7.0mm · 1.56mm/px · 1 of 25 slices shown]
[im 1/25]
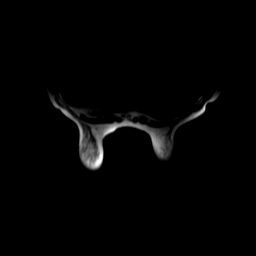

[Series 4: ax ir · axial · 3.0mm · 0.59mm/px · 1 of 64 slices shown]
[im 1/64]
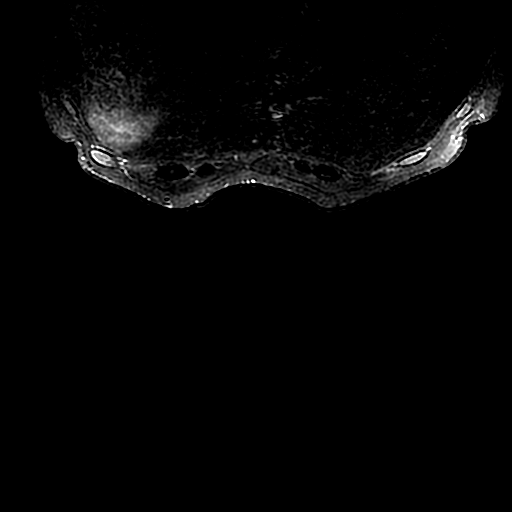

[Series 500: dynacad 2 capture · axial · 0.23mm/px · 1 of 1 slices shown]
[im 1/1]
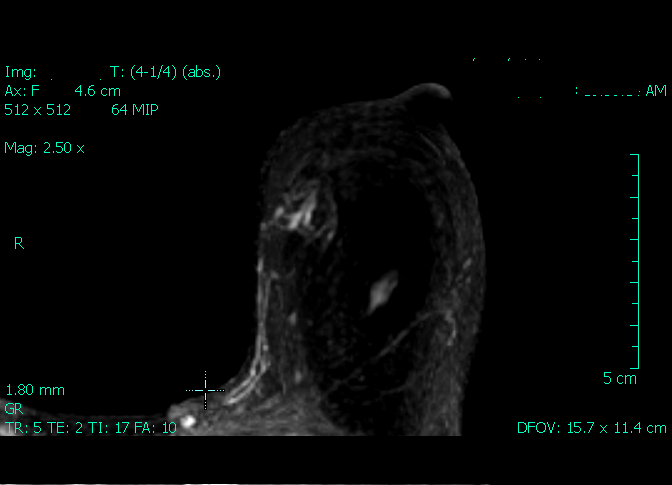

[Series 600: 12ml vibrant mph · axial · 1.8mm · 0.59mm/px · z∈[-124,+66]mm · 5 of 212 slices shown]
[im 1/212]
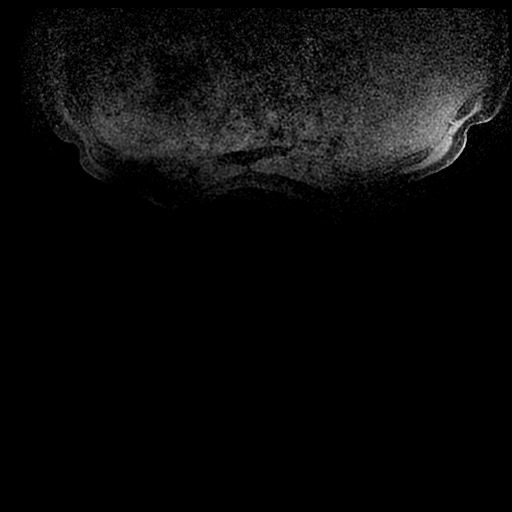
[im 53/212]
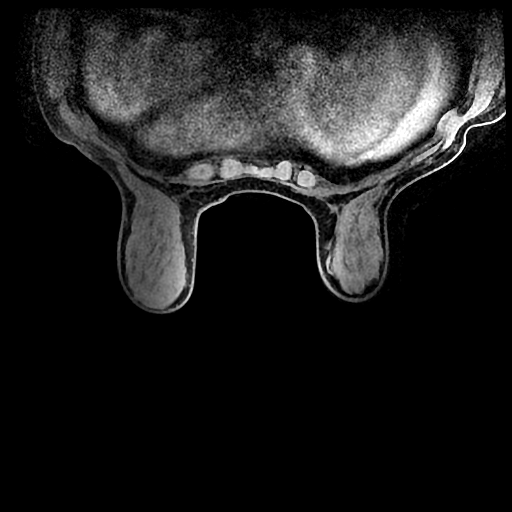
[im 106/212]
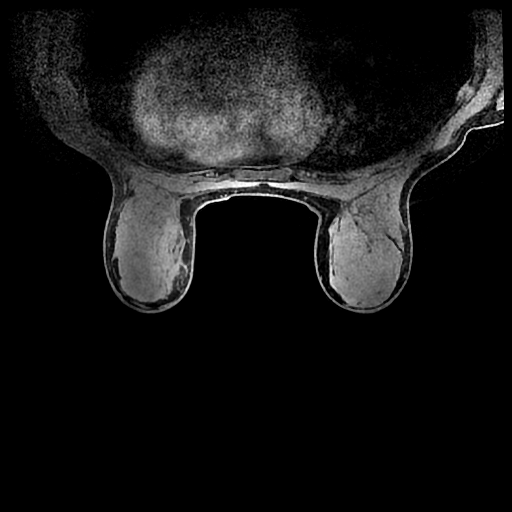
[im 159/212]
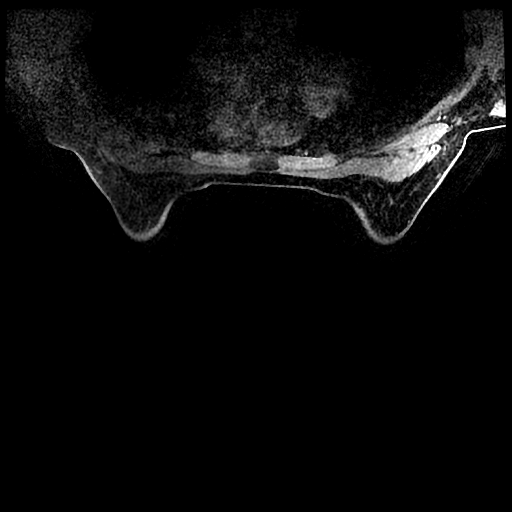
[im 212/212]
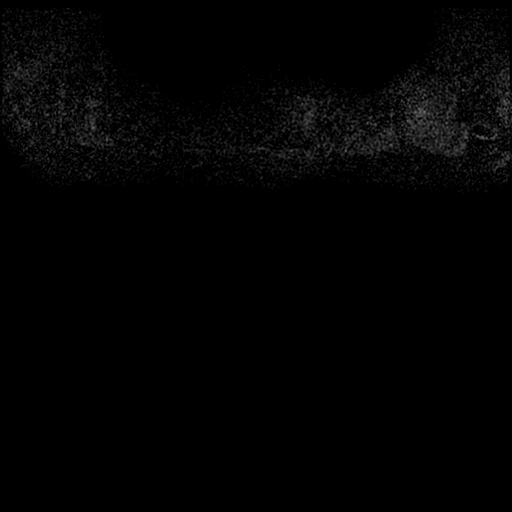

[Series 601: ph1/12ml vibrant mph · axial · 1.8mm · 0.59mm/px · z∈[-124,+66]mm · 5 of 212 slices shown]
[im 1/212]
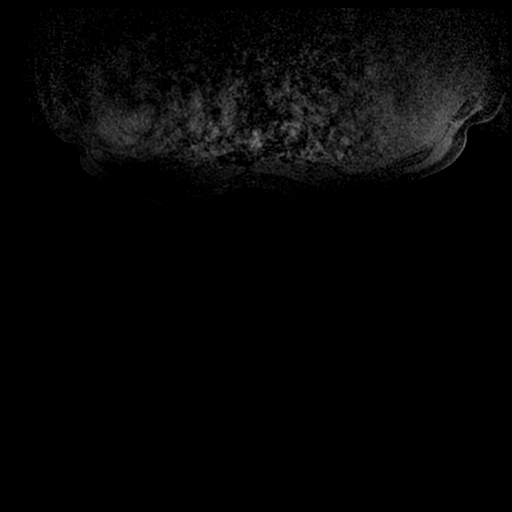
[im 53/212]
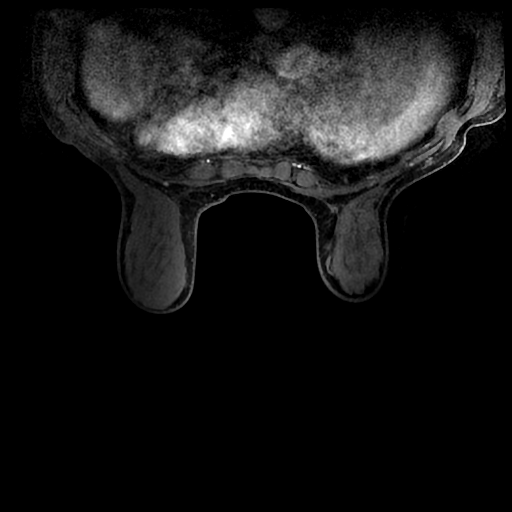
[im 106/212]
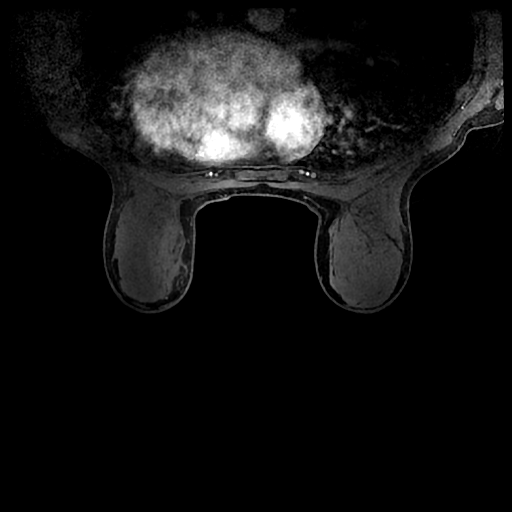
[im 159/212]
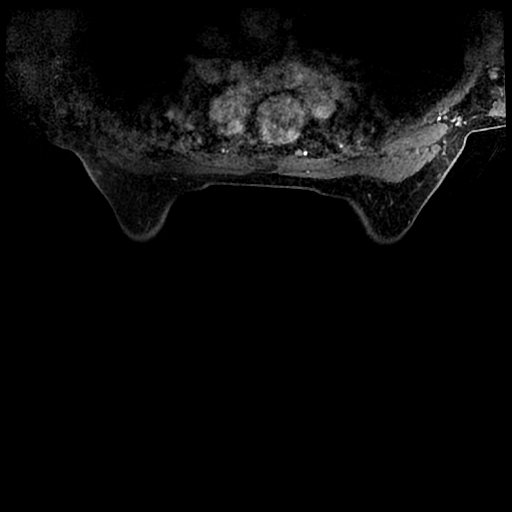
[im 212/212]
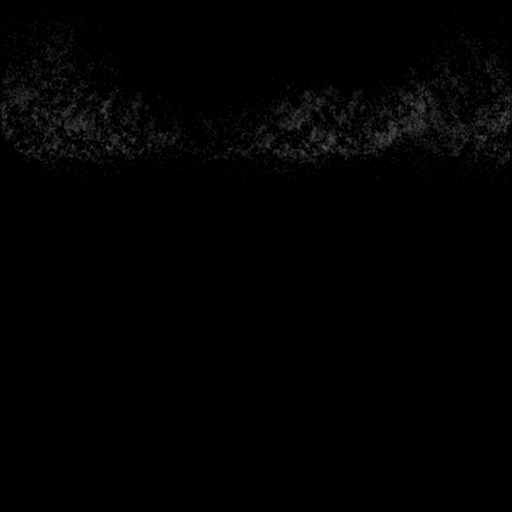

[Series 602: ph2/12ml vibrant mph · axial · 1.8mm · 0.59mm/px · z∈[-124,+66]mm · 5 of 212 slices shown]
[im 1/212]
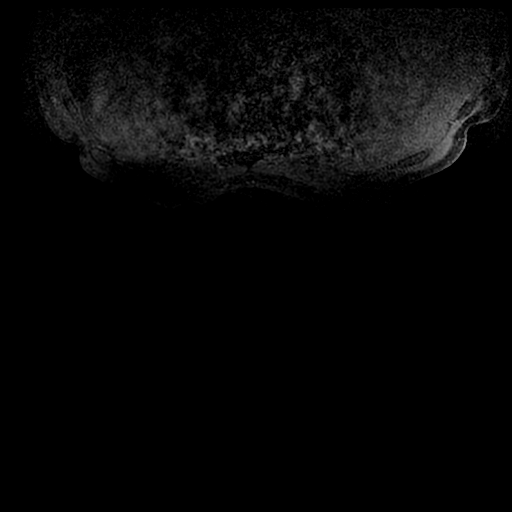
[im 53/212]
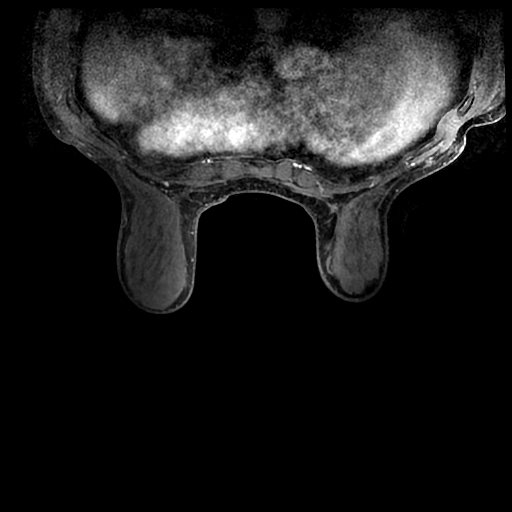
[im 106/212]
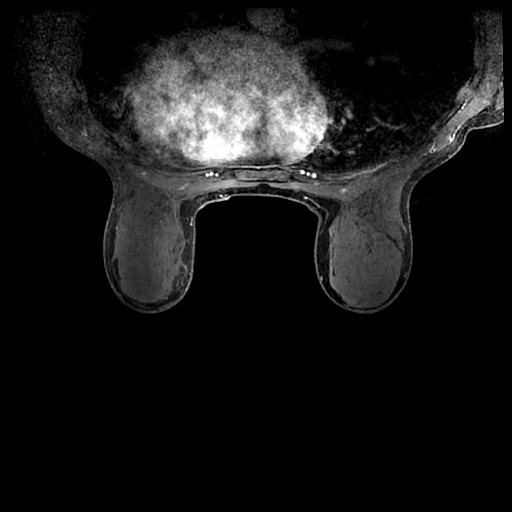
[im 159/212]
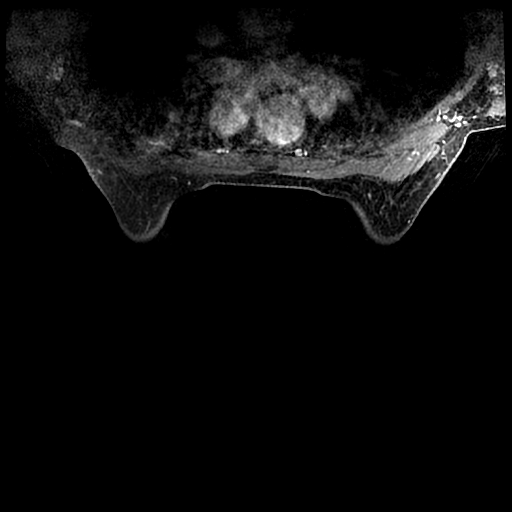
[im 212/212]
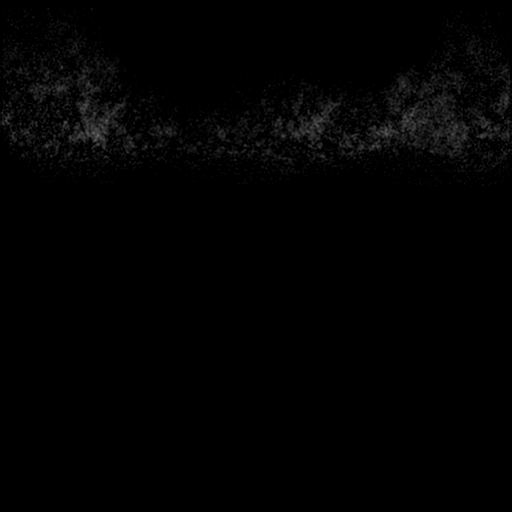

[18 of 48 positions shown; findings below may reference images not displayed]

THREE-DIMENSIONAL MR IMAGE RENDERING ON INDEPENDENT WORKSTATION:

Three-dimensional MR images were rendered by post-processing of the
original MR data on an independent workstation. The
three-dimensional MR images were interpreted, and findings are
reported in the following complete MRI report for this study. Three
dimensional images were evaluated at the independent DynaCad
workstation
FINDINGS: Breast composition: d. Extreme fibroglandular tissue.

Background parenchymal enhancement: Minimal

Right breast: Within the upper inner quadrant of the right breast
there is an enhancing mass associated with tissue marker clip. Mass
is round with irregular margins and demonstrates heterogeneous
internal enhancement and rapid wash- in and washout type kinetics.
Mass is 1.2 x 1.1 x 1.2 cm. No additional areas of concern
identified within the right breast.

Left breast: Within the upper inner quadrant of the left breast,
there is biopsy artifact following recent ultrasound guided core
biopsy. There is focal homogeneous non masslike enhancement within
the upper inner quadrant of the left breast, adjacent to the biopsy
track, possibly related to biopsy change or underlying breast
abnormality. The tissue marker clip is centered within the non
masslike enhancement. This area is 1.4 x 1.2 cm, best identified on
series [id number], image 76.

Within the lower outer quadrant of the left breast, there is linear
non masslike enhancement demonstrating slow persistent type
homogeneous enhancement and measuring 1.2 x 0.6 x 0.5 cm. There is
no mammographic correlate for this finding.

Lymph nodes: No abnormal appearing lymph nodes.

Ancillary findings:  None.
IMPRESSION: 1. Known malignancy within the upper inner quadrant of the right
breast measuring 1.2 cm by MRI.
2. Post biopsy changes in the upper inner quadrant of the left
breast, associated with minimal non masslike enhancement measuring
surrounding the biopsy cavity. Patient has been scheduled for a
second core biopsy of mass in the 11 o'clock location of the left
breast following discordant results of the previous biopsy.
3. Suspicious linear non masslike enhancement in the lower outer
quadrant of the left breast not previously evaluated.

RECOMMENDATION:
1. Planned second biopsy of abnormality in the upper inner quadrant
of the left breast. This has been scheduled at [REDACTED].
2. MR guided core biopsy of non masslike enhancement in the lower
outer quadrant of the left breast.
3. Treatment plan for known malignancy in the right breast.

BI-RADS CATEGORY  4: Suspicious.

## 2018-03-11 ENCOUNTER — Other Ambulatory Visit: Payer: Self-pay | Admitting: Oncology

## 2018-03-11 DIAGNOSIS — Z853 Personal history of malignant neoplasm of breast: Secondary | ICD-10-CM

## 2018-03-15 DIAGNOSIS — C50411 Malignant neoplasm of upper-outer quadrant of right female breast: Secondary | ICD-10-CM | POA: Diagnosis not present

## 2018-03-15 DIAGNOSIS — C50212 Malignant neoplasm of upper-inner quadrant of left female breast: Secondary | ICD-10-CM | POA: Diagnosis not present

## 2018-03-23 ENCOUNTER — Ambulatory Visit
Admission: RE | Admit: 2018-03-23 | Discharge: 2018-03-23 | Disposition: A | Discharge status: Home / Self Care | Payer: Medicare Other | Source: Ambulatory Visit | Attending: Oncology | Admitting: Oncology

## 2018-03-23 DIAGNOSIS — R922 Inconclusive mammogram: Secondary | ICD-10-CM | POA: Diagnosis not present

## 2018-03-23 DIAGNOSIS — Z853 Personal history of malignant neoplasm of breast: Secondary | ICD-10-CM

## 2018-03-23 HISTORY — DX: Personal history of irradiation: Z92.3

## 2018-05-01 NOTE — Progress Notes (Signed)
[place]  [phone] [phone]     ID: Jessica Ingram DOB: [birth year]  MR#: [id number]  KPT#:[id number]  Patient Care Team: Marda Stalker, PA-C as PCP - General (Family Medicine) Fanny Skates, MD as Consulting Physician (General Surgery) Jamorris Ndiaye, Virgie Dad, MD as Consulting Physician (Oncology) Kyung Rudd, MD as Consulting Physician (Radiation Oncology) Juanita Craver, MD as Consulting Physician (Gastroenterology) Sydnee Levans, MD as Consulting Physician (Dermatology) Azucena Fallen, MD as Consulting Physician (Obstetrics and Gynecology) Elsie Saas, MD as Consulting Physician (Orthopedic Surgery) Delice Bison, Charlestine Massed, NP as Nurse Practitioner (Hematology and Oncology) OTHER MD:  CHIEF COMPLAINT: Estrogen receptor positive breast cancer  CURRENT TREATMENT: Anastrozole   BREAST CANCER HISTORY: From the original intake note:  "Jessica Ingram" had bilateral screening mammography with tomography at [facility] 01/01/2017. The breast density was category D. In the right breast superiorly there was a 1.1 cm irregular mass. In the left breast there was an area of possible architectural distortion.  The patient was recalled 01/07/2017 or bilateral diagnostic mammography and bilateral ultrasonography. In the right breast superiorly there was a 1.5 cm irregular mass which was located by sonography in the upper inner quadrant area this measured 1.1 cm.  In the left breast the area of architectural distortion was again noted. By ultrasound this was an irregular mass with indistinct margins in the upper inner quadrant of the left breast.  Biopsy of both the areas in question was performed 01/11/2017. 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 The left breast biopsy showed only fibrocystic changes but this was felt to be discordant by radiology  The patient's subsequent history is as detailed below  INTERVAL HISTORY: Jessica Ingram returns today for follow-up and treatment of her estrogen receptor positive bilateral breast cancer.  The patient continues on anastrozole, which she tolerates well.  Since her last visit here she underwent a Diagnostic Bilateral breast mammogram with TOMO on 03/23/2018, breast density category D, showing no evidence of malignancy in either breast, status post bilateral lumpectomies.   Her most recent bone density was a year and a half ago on normal  REVIEW OF SYSTEMS: Jessica Ingram is doing well. Her only complaint is knee and heel pain. She does have arthritis, but she has not seen any for it. Jessica Ingram. She tries to avoid any exercises that might irritate her heel more because she thinks she has plantar fascitis. The patient denies unusual headaches, visual changes, nausea, vomiting, or dizziness. There has been no unusual cough, phlegm production, or pleurisy. This been no change in bowel or bladder habits. The patient denies unexplained fatigue or unexplained weight loss, bleeding, rash, or fever. A detailed review of systems was otherwise noncontributory.    PAST MEDICAL HISTORY: Past Medical History:  Diagnosis Date  . Arthritis   . Genetic testing 02/19/2017   Jessica Ingram underwent genetic counseling and testing for hereditary cancer syndromes on 02/02/2017. Her results were negative for mutations in all 46 genes analyzed by [facility]'s 46-gene Common Hereditary Cancers Panel. Genes analyzed include: APC, ATM, AXIN2, BARD1, BMPR1A, BRCA1, BRCA2, BRIP1, CDH1, CDKN2A, CHEK2, CTNNA1, DICER1, EPCAM, GREM1, HOXB13, KIT, MEN1, MLH1, MSH2, MSH3, MSH6, MUTYH, NBN,  . Hepatitis    at age 82  .  Personal history of radiation  therapy     PAST SURGICAL HISTORY: Past Surgical History:  Procedure Laterality Date  . ANTERIOR CRUCIATE LIGAMENT REPAIR Left   . AXILLARY SENTINEL NODE BIOPSY Left 03/29/2017   Procedure: LEFT AXILLARY SENTINEL LYMPH NODE BIOPSY, INJECT BLUE DYE LEFT BREAST;  Surgeon: Fanny Skates, MD;  Location: [place];  Service: General;  Laterality: Left;  . BREAST LUMPECTOMY Bilateral   . BREAST LUMPECTOMY WITH RADIOACTIVE SEED AND SENTINEL LYMPH NODE BIOPSY Bilateral 03/05/2017   Procedure: BILATERAL BREAST LUMPECTOMY WITH RADIOACTIVE SEED AND RIGHT AXILLARY SENTINEL LYMPH NODE BIOPSY;  Surgeon: Fanny Skates, MD;  Location: [place];  Service: General;  Laterality: Bilateral;  . FINE NEEDLE ASPIRATION Right 03/29/2017   Procedure: ASPIRATION OF SEROMA RIGHT AXILLA;  Surgeon: Fanny Skates, MD;  Location: [place];  Service: General;  Laterality: Right;    FAMILY HISTORY Family History  Problem Relation Age of Onset  . Lung cancer Mother 67       d.93 history of smoking  . Prostate cancer Father 45       d.83 prostate cancer metastasized to bone  . Colon cancer Brother 45       d.53  . Breast cancer Maternal Aunt 73       d.85s  . Colon cancer Maternal Aunt 83  . Bone cancer Paternal Aunt        d.85  . Prostate cancer Paternal Uncle 104       d.72s metastasized to bladder  . Kidney cancer Maternal Grandfather 24       d.53 possible colon cancer  . Lung cancer Paternal Uncle 11       d.60  . Brain cancer Cousin 72       d.11 paternal first-cousin. Son of uncle with prostate cancer.  . Prostate cancer Paternal Grandfather        d.89  The patient's father was diagnosed with prostate cancer at the age of 33. He died from unrelated causes at age 41. The patient's mother was diagnosed with lung cancer at age 77. She was a smoker. She died from unrelated causes at age 72. The patient has one brother, 5 sisters. The brother was diagnosed  with colon cancer at age 56 and died at age 44. There is also a maternal aunt diagnosed with both breast and colon cancers in her 34s. There is no history of ovarian cancer in the family  GYNECOLOGIC HISTORY:  No LMP recorded. Patient is postmenopausal. Menarche age 28, first live birth age 44, which increases the risk of breast cancer developing. The patient is GX P2. She went through the change of life age 60 and was on hormone replacement until age 52. She also took oral contraceptives remotely for about 12 years with no complications  SOCIAL HISTORY:  Jessica Ingram is widowed, her husband dying from complications of depression in September 2017. 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 The patient has no grandchildren. She is not a church attender     ADVANCED DIRECTIVES:  in place; the patient has named her sister Jessica Ingram as healthcare power of attorney. Rod Holler may be reached at [phone]  HEALTH MAINTENANCE: Social History   Tobacco Use  . Smoking status: Never Smoker  . Smokeless tobacco: Never Used  Substance Use Topics  . Alcohol use: Yes    Comment: 7  . Drug use: No     Colonoscopy: January 2017/man  PAP: 2014  Bone density: 2014? Went over OB/GYN   No Known Allergies  Current Outpatient Medications  Medication Sig Dispense Refill  . anastrozole (ARIMIDEX) 1 MG tablet Take 1 tablet (1 mg total) by mouth daily. 90 tablet 4   No current facility-administered medications for this visit.     OBJECTIVE: Middle-aged white woman who appears well  Vitals:   05/02/18 1047  BP: (!) 142/89  Pulse: 69  Resp: 18  Temp: 98.4 F (36.9 C)  SpO2: 100%     Body mass index is 22.74 kg/m.     ECOG FS:0 - Asymptomatic  Sclerae unicteric, EOMs intact Oropharynx clear and moist No cervical or supraclavicular adenopathy Lungs no rales or rhonchi Heart regular rate and rhythm Abd soft, nontender, positive bowel sounds MSK no focal spinal tenderness, no upper extremity lymphedema, mild osteoarthritis seen on hands Neuro: nonfocal, well oriented, appropriate affect Breasts: Status post bilateral lumpectomy and radiation, with no evidence of disease recurrence.  Both axillae are benign.  LAB RESULTS:  CMP     Component Value Date/Time   NA 137 09/21/2017 1054   K 4.4 09/21/2017 1054   CO2 25 09/21/2017 1054   GLUCOSE 101 09/21/2017 1054   BUN 12.6 09/21/2017 1054   CREATININE 0.8 09/21/2017 1054   CALCIUM 9.7 09/21/2017 1054   PROT 7.8 09/21/2017 1054   ALBUMIN 4.1 09/21/2017 1054   AST 22 09/21/2017 1054   ALT 21 09/21/2017 1054   ALKPHOS 81 09/21/2017 1054   BILITOT 0.45 09/21/2017 1054    No results found for: TOTALPROTELP, ALBUMINELP, A1GS, A2GS, BETS, BETA2SER, GAMS, MSPIKE, SPEI  No results found for: Nils Pyle, [facility]  Lab Results  Component Value Date   WBC 4.6 09/21/2017   NEUTROABS 3.3 09/21/2017   HGB 14.0 09/21/2017   HCT 41.8 09/21/2017   MCV 97.6 09/21/2017   PLT 180 09/21/2017      Chemistry      Component Value Date/Time   NA 137 09/21/2017 1054   K 4.4 09/21/2017 1054   CO2 25 09/21/2017 1054   BUN 12.6 09/21/2017 1054   CREATININE 0.8 09/21/2017 1054      Component Value Date/Time   CALCIUM 9.7 09/21/2017 1054   ALKPHOS 81 09/21/2017 1054   AST 22 09/21/2017 1054   ALT 21 09/21/2017 1054   BILITOT 0.45 09/21/2017 1054       No results found for: LABCA2  No components found for: [id number]  No results for input(s): INR in the last 168 hours.  Urinalysis No results found for: COLORURINE, APPEARANCEUR, LABSPEC, PHURINE, GLUCOSEU, HGBUR, BILIRUBINUR, KETONESUR, PROTEINUR, UROBILINOGEN, NITRITE,  LEUKOCYTESUR   STUDIES: Since her last visit here she underwent a Diagnostic Bilateral breast mammogram with TOMO on 03/23/2018, breast density category D, showing no evidence of malignancy in either breast, status post bilateral lumpectomies.   ELIGIBLE FOR AVAILABLE RESEARCH PROTOCOL: no  ASSESSMENT: 67 y.o. [place] woman status post right breast upper inner quadrant biopsy 01/11/2017 for a clinical  T1c N0, stage IA invasive ductal carcinoma, grade 1, estrogen and progesterone receptor positive, HER-2 nonamplified, with an MIB-1 of 5%.   (1) Left lumpectomy and sentinel lymph node sampling 03/05/2017 showed a pT1c pN0 invasive ductal  carcinoma, grade 1, with close but negative margins, estrogen receptor strongly positive, progesterone receptor negative, with an MIB-1 of 5% and no HER-2 amplification.  (a) left axillary sentinel lymph node biopsy 03/29/2017 showed no lymph node involvement  (2) right lumpectomy 03/05/2017 showed a pT1c pN0 invasive ductal carcinoma, grade 1, with negative margins  (3) Oncotype DX scores of 19-21 predicts a 10 year risk of recurrence outside the breast of 12-13% if the patient's only systemic therapy is tamoxifen for 5 years. It also predicts no benefit from chemotherapy.  (4) adjuvant radiation completed 05/26/2017  (5)  To start anastrozole 07/15/2017  (a) [facility] results pending  (6) genetics testing performed through [facility]'s Common Hereditary Cancers Panel reported out on 02/15/2017 showed no deleterious mutations in APC, ATM, AXIN2, BARD1, BMPR1A, BRCA1, BRCA2, BRIP1, CDH1, CDKN2A, CHEK2, CTNNA1, DICER1, EPCAM, GREM1, HOXB13, KIT, MEN1, MLH1, MSH2, MSH3, MSH6, MUTYH, NBN, NF1, NTHL1, PALB2, PDGFRA, PMS2, POLD1, POLE, PTEN, RAD50, RAD51C, RAD51D, SDHA, SDHB, SDHC, SDHD, SMAD4, SMARCA4, STK11, TP53, TSC1, TSC2, and VHL.   (a) Variants of uncertain significance (VUSs) were identified in three genes.    DICER1 c.2027G>C  (p.Arg676Pro)   TSC2 c.4325A>T (p.Glu1442Val)   CTNNA1 c.410G>A (p.Arg137Gln)  (7) breast density category D  PLAN: Jessica Ingram is now a little over a year out from definitive surgery for her breast cancer with no evidence of disease recurrence.  This is favorable.  She is tolerating anastrozole well.  I think the knee and hand problems she has a really due to arthritis and not to the anastrozole although of course the anastrozole could be making those symptoms worse.  I am concerned that she has very dense breasts.  Today we discussed obtaining a yearly MRI 6 months after mammography because of the high risk of breast cancer.  I have entered those orders and she will check on cost issues  Otherwise she will see Dr. Genia Harold in October and she will see me again in February.  She will then see Dr. Dalbert Batman again in June after her next May mammogram  She will have a repeat bone density with the May mammogram in 2020  She knows to call for any problems that may develop before the next visit.  Vibha Ferdig, Virgie Dad, MD  05/02/18 11:02 AM Medical Oncology and Hematology [facility] [address] [place], [place] [address] Tel. [phone]    Fax. [phone]  I, Margit Banda am acting as a scribe for Chauncey Cruel, MD.   I, Lurline Del MD, have reviewed the above documentation for accuracy and completeness, and I agree with the above.

## 2018-05-02 ENCOUNTER — Inpatient Hospital Stay: Payer: Medicare Other | Attending: Oncology | Admitting: Oncology

## 2018-05-02 ENCOUNTER — Telehealth: Payer: Self-pay | Admitting: Oncology

## 2018-05-02 VITALS — BP 142/89 | HR 69 | Temp 98.4°F | Resp 18 | Ht 66.5 in | Wt 143.0 lb

## 2018-05-02 DIAGNOSIS — Z79899 Other long term (current) drug therapy: Secondary | ICD-10-CM | POA: Diagnosis not present

## 2018-05-02 DIAGNOSIS — M1990 Unspecified osteoarthritis, unspecified site: Secondary | ICD-10-CM | POA: Insufficient documentation

## 2018-05-02 DIAGNOSIS — Z923 Personal history of irradiation: Secondary | ICD-10-CM | POA: Insufficient documentation

## 2018-05-02 DIAGNOSIS — Z801 Family history of malignant neoplasm of trachea, bronchus and lung: Secondary | ICD-10-CM | POA: Insufficient documentation

## 2018-05-02 DIAGNOSIS — R9230 Dense breasts, unspecified: Secondary | ICD-10-CM

## 2018-05-02 DIAGNOSIS — Z170 Estrogen receptor positive status [ER+]: Secondary | ICD-10-CM | POA: Insufficient documentation

## 2018-05-02 DIAGNOSIS — Z79811 Long term (current) use of aromatase inhibitors: Secondary | ICD-10-CM | POA: Insufficient documentation

## 2018-05-02 DIAGNOSIS — C50211 Malignant neoplasm of upper-inner quadrant of right female breast: Secondary | ICD-10-CM

## 2018-05-02 DIAGNOSIS — C50212 Malignant neoplasm of upper-inner quadrant of left female breast: Secondary | ICD-10-CM | POA: Diagnosis not present

## 2018-05-02 DIAGNOSIS — Z8042 Family history of malignant neoplasm of prostate: Secondary | ICD-10-CM | POA: Insufficient documentation

## 2018-05-02 DIAGNOSIS — Z800 Family history of malignant neoplasm of digestive organs: Secondary | ICD-10-CM | POA: Diagnosis not present

## 2018-05-02 DIAGNOSIS — R922 Inconclusive mammogram: Secondary | ICD-10-CM

## 2018-05-02 NOTE — Telephone Encounter (Signed)
Gave patient avs and calendar of upcoming feb appts.

## 2018-06-09 ENCOUNTER — Other Ambulatory Visit: Payer: Self-pay | Admitting: Oncology

## 2018-07-22 ENCOUNTER — Encounter: Payer: Self-pay | Admitting: Internal Medicine

## 2018-07-22 ENCOUNTER — Ambulatory Visit (INDEPENDENT_AMBULATORY_CARE_PROVIDER_SITE_OTHER): Payer: Self-pay | Admitting: Internal Medicine

## 2018-07-22 DIAGNOSIS — Z789 Other specified health status: Secondary | ICD-10-CM

## 2018-07-22 DIAGNOSIS — Z7189 Other specified counseling: Secondary | ICD-10-CM

## 2018-07-22 DIAGNOSIS — Z7185 Encounter for immunization safety counseling: Secondary | ICD-10-CM

## 2018-07-22 DIAGNOSIS — Z23 Encounter for immunization: Secondary | ICD-10-CM

## 2018-07-22 DIAGNOSIS — Z9189 Other specified personal risk factors, not elsewhere classified: Secondary | ICD-10-CM

## 2018-07-22 DIAGNOSIS — Z7184 Encounter for health counseling related to travel: Secondary | ICD-10-CM | POA: Insufficient documentation

## 2018-07-22 MED ORDER — AZITHROMYCIN 500 MG PO TABS: 1000.0000 mg | ORAL_TABLET | Freq: Once | ORAL | 0 refills | Status: DC

## 2018-07-22 MED ORDER — ONDANSETRON HCL 4 MG PO TABS: 4.0000 mg | ORAL_TABLET | Freq: Three times a day (TID) | ORAL | 0 refills | Status: DC | PRN

## 2018-07-22 MED ORDER — DOXYCYCLINE HYCLATE 100 MG PO TABS: 100.0000 mg | ORAL_TABLET | Freq: Every day | ORAL | 0 refills | Status: DC

## 2018-07-22 MED ORDER — AZITHROMYCIN 500 MG PO TABS: 1000.0000 mg | ORAL_TABLET | Freq: Once | ORAL | 0 refills | Status: AC

## 2018-07-22 NOTE — Addendum Note (Signed)
Addended by: Lenore Cordia on: 07/22/2018 10:11 AM   Modules accepted: Orders

## 2018-07-22 NOTE — Patient Instructions (Signed)
[facility]                [address] [place]                   [place], [place] [address]                      Phone: [phone]                        Fax: [phone]   Planned departure date: August 23, 2018          Planned return date: November 12 Countries of travel: [place] and [place]   Guidelines for the Prevention & Treatment of Traveler's Diarrhea  Prevention: "Boil it, Peel it, Tina it, or Forget it"   the fewer chances -> lower risk: try to stick to food & water precautions as much as possible"   If it's "piping hot"; it is probably okay, if not, it may not be   Treatment   1) You should always take care to drink lots of fluids in order to avoid dehydration   2) You should bring medications with you in case you come down with a case of diarrhea   3) OTC = bring pepto-bismol - can take with initial abdominal symptoms;                    Imodium - can help slow down your intestinal tract, can help relief cramps                    and diarrhea, can take if no bloody diarrhea  Use azithromycin if needed for traveler's diarrhea  Guidelines for the Prevention of Malaria  Avoidance:  -fewer mosquito bites = lower risk. Mosquitos can bite at night as well as daytime  -cover up (long sleeve clothing), mosquito nets, screens  -Insect repellent for your skin ( DEET containing lotion > 20%): for clothes ( permethrin spray)   2 days prior to travel to [place],  start doxycycline, daily dose starting 2 days before entering endemic area, ending 4 weeks after leaving area for malaria prevention.   Immunizations received today: Typhoid (parenteral)  Future immunizations, if indicated none indicated   Prior to travel:  1) Be sure to pick up appropriate prescriptions, including medicine you take daily. Do not expect to be able to fill your prescriptions abroad.  2) Strongly consider obtaining traveler's insurance,  including emergency evacuation insurance. Most plans in the [place] do not cover participants abroad. (see below for resources)  3) Register at the appropriate [place] embassy or consulate with travel dates so they are aware of your presence in-country and for helpful advice during travel using the [employer] (STEP, GreenNylon.com.cy).  4) Leave contact information with a relative or friend.  5) Keep a Research officer, political party, credit cards in case they become lost or stolen  6) Inform your credit card company that you will be travelling abroad   During travel:  1) If you become ill and need medical advice, the [place] [employer]	[place] of the country you are traveling in general provides a list of [place] speaking doctors.  We are also available on MyChart for remote consultation if you register prior to travel. 2) Avoid motorcycles or scooters when at all possible. Traffic laws in many countries are lax and accidents  occur frequently.  3) Do not take any unnecessary risks that you wouldn't do at home.   Resources:  -Country specific information: BlindResource.ca or GreenNylon.com.cy  -Press photographer (DEET, mosquito nets): [employer], [employer] store, [employer], [employer] insurance options: gatewayplans.com; http://clayton-rivera.info/; travelguard.com or Good [employer], gninsurance.com or info@gninsurance .com, H1235423.   Post Travel:  If you return from your trip ill, call your primary care doctor or our travel clinic @ [phone].   Enjoy your trip and know that with proper pre-travel preparation, most people have an enjoyable and uninterrupted trip!

## 2018-07-22 NOTE — Progress Notes (Signed)
Patient ID: Jessica Ingram, female   DOB: 07/13/1951, 67 y.o.   MRN: [id number] Subjective:   Jessica Ingram is a 67 y.o. female who presents to the Infectious Disease clinic for travel consultation. Planned departure date: August 23, 2018          Planned return date: September 06, 2018 Countries of travel: [place] and [place] Areas in country: safari, travel group   Accommodations: camping Purpose of travel: vacation Prior travel out of [place]: yes     Objective:   Medications: reviewed    Assessment:   No contraindications to travel. none     Plan:    Issues discussed: freshwater swimming, insect-borne illnesses, malaria, motion sickness, MVA safety, rabies, safe food/water, traveler's diarrhea, website/handouts for more information, what to do if ill upon return, what to do if ill while there and Yellow Fever. Immunizations recommended: Typhoid (parenteral).She has had natural hepatitis A infection and Tdap 6 years ago.  Advised to get flu shot prior to travel at local pharmacy  Malaria prophylaxis: doxycycline, daily dose starting 2 days before entering endemic area, ending 4 weeks after leaving area Traveler's diarrhea prophylaxis: azithromycin. Total duration of visit: 1 Hour. Total time spent on education, counseling, coordination of care: 30 Minutes.

## 2018-08-02 DIAGNOSIS — Z23 Encounter for immunization: Secondary | ICD-10-CM | POA: Diagnosis not present

## 2018-08-12 DIAGNOSIS — H52223 Regular astigmatism, bilateral: Secondary | ICD-10-CM | POA: Diagnosis not present

## 2018-08-12 DIAGNOSIS — H5203 Hypermetropia, bilateral: Secondary | ICD-10-CM | POA: Diagnosis not present

## 2018-08-12 DIAGNOSIS — H43393 Other vitreous opacities, bilateral: Secondary | ICD-10-CM | POA: Diagnosis not present

## 2018-08-12 DIAGNOSIS — H524 Presbyopia: Secondary | ICD-10-CM | POA: Diagnosis not present

## 2018-08-12 DIAGNOSIS — H2513 Age-related nuclear cataract, bilateral: Secondary | ICD-10-CM | POA: Diagnosis not present

## 2018-08-16 ENCOUNTER — Telehealth: Payer: Self-pay

## 2018-08-16 NOTE — Telephone Encounter (Signed)
Ms. Suliman called today with concerns regarding Malaria medication and her daughter who is eight weeks pregnant. Patient states that her daughter lives in [place] and is unable to make it into our clinic before her trip to [place] and [place]. Ms. Dava Najjar would like to know what type of Malaria medication Dr. Linus Salmons would recommend. Will route message to Dr. Linus Salmons to advise. [place]

## 2018-08-17 NOTE — Telephone Encounter (Signed)
Weekly mefloquine is the one recommended in pregnancy.

## 2018-09-08 ENCOUNTER — Other Ambulatory Visit: Payer: Self-pay | Admitting: Oncology

## 2018-12-02 ENCOUNTER — Other Ambulatory Visit: Payer: Self-pay

## 2018-12-02 DIAGNOSIS — C50211 Malignant neoplasm of upper-inner quadrant of right female breast: Secondary | ICD-10-CM

## 2018-12-02 DIAGNOSIS — Z170 Estrogen receptor positive status [ER+]: Secondary | ICD-10-CM

## 2018-12-05 ENCOUNTER — Inpatient Hospital Stay: Payer: Medicare Other | Attending: Oncology

## 2018-12-05 ENCOUNTER — Inpatient Hospital Stay (HOSPITAL_BASED_OUTPATIENT_CLINIC_OR_DEPARTMENT_OTHER): Payer: Medicare Other | Admitting: Oncology

## 2018-12-05 VITALS — BP 147/92 | HR 62 | Temp 97.6°F | Resp 18 | Wt 137.1 lb

## 2018-12-05 DIAGNOSIS — Z170 Estrogen receptor positive status [ER+]: Secondary | ICD-10-CM | POA: Insufficient documentation

## 2018-12-05 DIAGNOSIS — Z79899 Other long term (current) drug therapy: Secondary | ICD-10-CM | POA: Insufficient documentation

## 2018-12-05 DIAGNOSIS — C50212 Malignant neoplasm of upper-inner quadrant of left female breast: Secondary | ICD-10-CM | POA: Diagnosis not present

## 2018-12-05 DIAGNOSIS — Z8042 Family history of malignant neoplasm of prostate: Secondary | ICD-10-CM | POA: Insufficient documentation

## 2018-12-05 DIAGNOSIS — R232 Flushing: Secondary | ICD-10-CM | POA: Insufficient documentation

## 2018-12-05 DIAGNOSIS — Z803 Family history of malignant neoplasm of breast: Secondary | ICD-10-CM | POA: Diagnosis not present

## 2018-12-05 DIAGNOSIS — C50211 Malignant neoplasm of upper-inner quadrant of right female breast: Secondary | ICD-10-CM

## 2018-12-05 DIAGNOSIS — Z923 Personal history of irradiation: Secondary | ICD-10-CM

## 2018-12-05 DIAGNOSIS — M1990 Unspecified osteoarthritis, unspecified site: Secondary | ICD-10-CM

## 2018-12-05 DIAGNOSIS — Z800 Family history of malignant neoplasm of digestive organs: Secondary | ICD-10-CM

## 2018-12-05 DIAGNOSIS — Z79811 Long term (current) use of aromatase inhibitors: Secondary | ICD-10-CM | POA: Insufficient documentation

## 2018-12-05 DIAGNOSIS — Z801 Family history of malignant neoplasm of trachea, bronchus and lung: Secondary | ICD-10-CM | POA: Insufficient documentation

## 2018-12-05 LAB — CBC WITH DIFFERENTIAL (CANCER CENTER ONLY)
Abs Immature Granulocytes: 0.01 10*3/uL (ref 0.00–0.07)
Basophils Absolute: 0 10*3/uL (ref 0.0–0.1)
Basophils Relative: 0 %
Eosinophils Absolute: 0 10*3/uL (ref 0.0–0.5)
Eosinophils Relative: 1 %
HCT: 41.5 % (ref 36.0–46.0)
Hemoglobin: 13.9 g/dL (ref 12.0–15.0)
Immature Granulocytes: 0 %
Lymphocytes Relative: 29 %
Lymphs Abs: 1.1 10*3/uL (ref 0.7–4.0)
MCH: 32.5 pg (ref 26.0–34.0)
MCHC: 33.5 g/dL (ref 30.0–36.0)
MCV: 97 fL (ref 80.0–100.0)
Monocytes Absolute: 0.4 10*3/uL (ref 0.1–1.0)
Monocytes Relative: 11 %
Neutro Abs: 2.1 10*3/uL (ref 1.7–7.7)
Neutrophils Relative %: 59 %
Platelet Count: 153 10*3/uL (ref 150–400)
RBC: 4.28 MIL/uL (ref 3.87–5.11)
RDW: 11.7 % (ref 11.5–15.5)
WBC Count: 3.6 10*3/uL — ABNORMAL LOW (ref 4.0–10.5)
nRBC: 0 % (ref 0.0–0.2)

## 2018-12-05 LAB — CMP (CANCER CENTER ONLY)
ALT: 16 U/L (ref 0–44)
AST: 20 U/L (ref 15–41)
Albumin: 4.7 g/dL (ref 3.5–5.0)
Alkaline Phosphatase: 56 U/L (ref 38–126)
Anion gap: 10 (ref 5–15)
BUN: 13 mg/dL (ref 8–23)
CO2: 24 mmol/L (ref 22–32)
Calcium: 9.7 mg/dL (ref 8.9–10.3)
Chloride: 105 mmol/L (ref 98–111)
Creatinine: 0.92 mg/dL (ref 0.44–1.00)
GFR, Est AFR Am: >60 mL/min (ref >60)
GFR, Est Non Af Am: >60 mL/min (ref >60)
Glucose, Bld: 97 mg/dL (ref 70–99)
Potassium: 4.7 mmol/L (ref 3.5–5.1)
Sodium: 139 mmol/L (ref 135–145)
Total Bilirubin: 0.8 mg/dL (ref 0.3–1.2)
Total Protein: 7.5 g/dL (ref 6.5–8.1)

## 2018-12-05 MED ORDER — ANASTROZOLE 1 MG PO TABS: 1.0000 mg | ORAL_TABLET | Freq: Every day | ORAL | 4 refills | Status: DC

## 2018-12-05 NOTE — Progress Notes (Signed)
[place]  [phone] [phone]     ID: EDDIS PINGLETON DOB: 1951/09/21  MR#: [id number]  JYN#:[id number]  Patient Care Team: Marda Stalker, PA-C as PCP - General (Family Medicine) Fanny Skates, MD as Consulting Physician (General Surgery) Tarance Balan, Virgie Dad, MD as Consulting Physician (Oncology) Kyung Rudd, MD as Consulting Physician (Radiation Oncology) Juanita Craver, MD as Consulting Physician (Gastroenterology) Sydnee Levans, MD as Consulting Physician (Dermatology) Azucena Fallen, MD as Consulting Physician (Obstetrics and Gynecology) Elsie Saas, MD as Consulting Physician (Orthopedic Surgery) Delice Bison, Charlestine Massed, NP as Nurse Practitioner (Hematology and Oncology) OTHER MD:  CHIEF COMPLAINT: Estrogen receptor positive breast cancer  CURRENT TREATMENT: Anastrozole   BREAST CANCER HISTORY: From the original intake note:  "Jessica Ingram" had bilateral screening mammography with tomography at [facility] 01/01/2017. The breast density was category D. In the right breast superiorly there was a 1.1 cm irregular mass. In the left breast there was an area of possible architectural distortion.  The patient was recalled 01/07/2017 or bilateral diagnostic mammography and bilateral ultrasonography. In the right breast superiorly there was a 1.5 cm irregular mass which was located by sonography in the upper inner quadrant area this measured 1.1 cm.  In the left breast the area of architectural distortion was again noted. By ultrasound this was an irregular mass with indistinct margins in the upper inner quadrant of the left breast.  Biopsy of both the areas in question was performed 01/11/2017. 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 The left breast biopsy showed only fibrocystic changes but this was felt to be discordant by radiology  The patient's subsequent history is as detailed below  INTERVAL HISTORY: Jessica Ingram returns today for follow-up and treatment of her estrogen receptor positive bilateral breast cancer.  The patient continues on anastrozole, which she tolerates well. She reports hot flashes that are "better than before." She also reports vaginal dryness.  Her interval history is unremarkable. She will undergo bilateral breast MRI and bone density in May 2020.   REVIEW OF SYSTEMS: Jessica Ingram reports "achyness" to her hips. She states she walks with her dog at least a mile every day. She continues to attend her spin classes. The patient denies unusual headaches, visual changes, nausea, vomiting, stiff neck, dizziness, or gait imbalance. There has been no cough, phlegm production, or pleurisy, no chest pain or pressure, and no change in bowel or bladder habits. The patient denies fever, rash, bleeding, unexplained fatigue or unexplained weight loss. A detailed review of systems was otherwise entirely negative.  PAST MEDICAL HISTORY: Past Medical History:  Diagnosis Date  . Arthritis   . Genetic testing 02/19/2017   Ms. Petite underwent genetic counseling and testing for hereditary cancer syndromes on 02/02/2017. Her results were negative for mutations in all 46 genes analyzed by [facility]'s 46-gene Common Hereditary Cancers Panel. Genes analyzed include: APC, ATM, AXIN2, BARD1, BMPR1A, BRCA1, BRCA2, BRIP1, CDH1, CDKN2A, CHEK2, CTNNA1, DICER1, EPCAM, GREM1, HOXB13, KIT, MEN1, MLH1, MSH2, MSH3, MSH6, MUTYH, NBN,  . Hepatitis    at age 22  . Personal history of radiation therapy     PAST SURGICAL HISTORY: Past Surgical History:  Procedure Laterality Date  . ANTERIOR CRUCIATE LIGAMENT REPAIR Left   . AXILLARY SENTINEL NODE BIOPSY Left 03/29/2017   Procedure: LEFT AXILLARY  SENTINEL LYMPH NODE BIOPSY,  INJECT BLUE DYE LEFT BREAST;  Surgeon: Fanny Skates, MD;  Location: [place];  Service: General;  Laterality: Left;  . BREAST LUMPECTOMY Bilateral   . BREAST LUMPECTOMY WITH RADIOACTIVE SEED AND SENTINEL LYMPH NODE BIOPSY Bilateral 03/05/2017   Procedure: BILATERAL BREAST LUMPECTOMY WITH RADIOACTIVE SEED AND RIGHT AXILLARY SENTINEL LYMPH NODE BIOPSY;  Surgeon: Fanny Skates, MD;  Location: [place];  Service: General;  Laterality: Bilateral;  . FINE NEEDLE ASPIRATION Right 03/29/2017   Procedure: ASPIRATION OF SEROMA RIGHT AXILLA;  Surgeon: Fanny Skates, MD;  Location: [place];  Service: General;  Laterality: Right;    FAMILY HISTORY Family History  Problem Relation Age of Onset  . Lung cancer Mother 38       d.93 history of smoking  . Prostate cancer Father 60       d.83 prostate cancer metastasized to bone  . Colon cancer Brother 53       d.53  . Breast cancer Maternal Aunt 73       d.85s  . Colon cancer Maternal Aunt 83  . Bone cancer Paternal Aunt        d.85  . Prostate cancer Paternal Uncle 52       d.72s metastasized to bladder  . Kidney cancer Maternal Grandfather 56       d.53 possible colon cancer  . Lung cancer Paternal Uncle 59       d.60  . Brain cancer Cousin 11       d.11 paternal first-cousin. Son of uncle with prostate cancer.  . Prostate cancer Paternal Grandfather        d.89  The patient's father was diagnosed with prostate cancer at the age of 30. He died from unrelated causes at age 67. The patient's mother was diagnosed with lung cancer at age 82. She was a smoker. She died from unrelated causes at age 58. The patient has one brother, 5 sisters. The brother was diagnosed with colon cancer at age 39 and died at age 32. There is also a maternal aunt diagnosed with both breast and colon cancers in her 46s. There is no history of ovarian cancer in the family  GYNECOLOGIC HISTORY:  No LMP recorded. Patient  is postmenopausal. Menarche age 69, first live birth age 42, which increases the risk of breast cancer developing. The patient is GX P2. She went through the change of life age 34 and was on hormone replacement until age 17. She also took oral contraceptives remotely for about 12 years with no complications  SOCIAL HISTORY:  Jessica Ingram is widowed; her husband died from complications of depression in September 2017. 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  Her daughter Jolyne Loa lives in [place] where she works as an Therapist, music, and daughter [place]ble [place] lives in [place] where she works for the baseball program at [employer].  Patient will be a grandmother to a baby girl in May 2020. She is not a Ambulance person.    ADVANCED DIRECTIVES:  in place; the patient has named her sister Bevely Palmer as healthcare power of attorney. Rod Holler may be reached at [phone]    HEALTH MAINTENANCE: Social History   Tobacco Use  . Smoking status: Never Smoker  . Smokeless tobacco: Never Used  Substance Use Topics  . Alcohol use: Yes  Comment: 7  . Drug use: No     Colonoscopy: January 2017/man  PAP: 2014  Bone density: 2014? Went over OB/GYN   No Known Allergies  Current Outpatient Medications  Medication Sig Dispense Refill  . anastrozole (ARIMIDEX) 1 MG tablet Take 1 tablet (1 mg total) by mouth daily. 90 tablet 4  . ondansetron (ZOFRAN) 4 MG tablet Take 1 tablet (4 mg total) by mouth every 8 (eight) hours as needed for nausea or vomiting. 8 tablet 0   No current facility-administered medications for this visit.     OBJECTIVE: Middle-aged white woman no acute distress  Vitals:   12/05/18 1038  BP: (!) 147/92  Pulse: 62  Resp: 18  Temp: 97.6 F (36.4 C)  SpO2: 100%     Body mass index is 21.8 kg/m.    ECOG FS:0 - Asymptomatic  Sclerae unicteric, pupils round and  equal Oropharynx clear and moist No cervical or supraclavicular adenopathy Lungs no rales or rhonchi Heart regular rate and rhythm Abd soft, nontender, positive bowel sounds MSK no focal spinal tenderness, no upper extremity lymphedema Neuro: nonfocal, well oriented, appropriate affect Breasts: Status post bilateral lumpectomy and bilateral radiation, with no evidence of disease recurrence.  Both axillae are benign.  LAB RESULTS:  CMP     Component Value Date/Time   NA 137 09/21/2017 1054   K 4.4 09/21/2017 1054   CO2 25 09/21/2017 1054   GLUCOSE 101 09/21/2017 1054   BUN 12.6 09/21/2017 1054   CREATININE 0.8 09/21/2017 1054   CALCIUM 9.7 09/21/2017 1054   PROT 7.8 09/21/2017 1054   ALBUMIN 4.1 09/21/2017 1054   AST 22 09/21/2017 1054   ALT 21 09/21/2017 1054   ALKPHOS 81 09/21/2017 1054   BILITOT 0.45 09/21/2017 1054    No results found for: TOTALPROTELP, ALBUMINELP, A1GS, A2GS, BETS, BETA2SER, GAMS, MSPIKE, SPEI  No results found for: Nils Pyle, [facility]  Lab Results  Component Value Date   WBC 3.6 (L) 12/05/2018   NEUTROABS 2.1 12/05/2018   HGB 13.9 12/05/2018   HCT 41.5 12/05/2018   MCV 97.0 12/05/2018   PLT 153 12/05/2018      Chemistry      Component Value Date/Time   NA 137 09/21/2017 1054   K 4.4 09/21/2017 1054   CO2 25 09/21/2017 1054   BUN 12.6 09/21/2017 1054   CREATININE 0.8 09/21/2017 1054      Component Value Date/Time   CALCIUM 9.7 09/21/2017 1054   ALKPHOS 81 09/21/2017 1054   AST 22 09/21/2017 1054   ALT 21 09/21/2017 1054   BILITOT 0.45 09/21/2017 1054       No results found for: LABCA2  No components found for: [id number]  No results for input(s): INR in the last 168 hours.  Urinalysis No results found for: COLORURINE, APPEARANCEUR, LABSPEC, PHURINE, GLUCOSEU, HGBUR, BILIRUBINUR, KETONESUR, PROTEINUR, UROBILINOGEN, NITRITE, LEUKOCYTESUR   STUDIES: Breast MRI pending  ELIGIBLE FOR AVAILABLE RESEARCH  PROTOCOL: no  ASSESSMENT: 68 y.o. [place] woman status post right breast upper inner quadrant biopsy 01/11/2017 for a clinical  T1c N0, stage IA invasive ductal carcinoma, grade 1, estrogen and progesterone receptor positive, HER-2 nonamplified, with an MIB-1 of 5%.   (1) Left lumpectomy and sentinel lymph node sampling 03/05/2017 showed a pT1c pN0 invasive ductal carcinoma, grade 1, with close but negative margins, estrogen receptor strongly positive, progesterone receptor negative, with an MIB-1 of 5% and no HER-2 amplification.  (a) left axillary sentinel lymph node biopsy 03/29/2017 showed no  lymph node involvement  (2) right lumpectomy 03/05/2017 showed a pT1c pN0 invasive ductal carcinoma, grade 1, with negative margins  (3) Oncotype DX scores of 19-21 predicts a 10 year risk of recurrence outside the breast of 12-13% if the patient's only systemic therapy is tamoxifen for 5 years. It also predicts no benefit from chemotherapy.  (4) adjuvant radiation completed 05/26/2017  (5) started anastrozole 07/15/2017  (a) bone density at [facility] 09/22/2017 showed a T score of -0.8  (6) genetics testing 02/15/2017 through [facility]'s Common Hereditary Cancers Panel showed no deleterious mutations in APC, ATM, AXIN2, BARD1, BMPR1A, BRCA1, BRCA2, BRIP1, CDH1, CDKN2A, CHEK2, CTNNA1, DICER1, EPCAM, GREM1, HOXB13, KIT, MEN1, MLH1, MSH2, MSH3, MSH6, MUTYH, NBN, NF1, NTHL1, PALB2, PDGFRA, PMS2, POLD1, POLE, PTEN, RAD50, RAD51C, RAD51D, SDHA, SDHB, SDHC, SDHD, SMAD4, SMARCA4, STK11, TP53, TSC1, TSC2, and VHL.   (a) Variants of uncertain significance (VUSs) were identified in three genes.    DICER1 c.2027G>C (p.Arg676Pro)   TSC2 c.4325A>T (p.Glu1442Val)   CTNNA1 c.410G>A (p.Arg137Gln)  (7) breast density category D  (a) yearly breast MRI scheduled for 03/15/2019  PLAN: Jessica Ingram is now a year and a half out from definitive surgery for her breast cancer with no evidence of disease recurrence.  This  is very favorable.  She is tolerating anastrozole well.  The plan will be to continue that a total of 5 years.  She will need a repeat bone density scan this November and I have placed the order.  We discussed her breast density.  There is a clear indication for breast MRI yearly in cases like hers and we usually obtain those 6 months after the mammography.  Accordingly she will have her breast MRI in May.  If there is any significant cost issues she will let me know.  I think she would benefit from pelvic health program and also from doing yoga here at 6 PM on Wednesdays and I gave her information regarding that  Otherwise at this point I feel comfortable seeing her on a once a year basis.  She will see me again next February  I congratulated her on her upcoming grandmotherhood. Tino Ronan, Virgie Dad, MD  12/05/18 11:09 AM Medical Oncology and Hematology [facility] [address] [place], [place] [address] Tel. [phone]    Fax. [phone]  I, Wilburn Mylar, am acting as scribe for Dr. Virgie Dad. Torrin Frein.  I, Lurline Del MD, have reviewed the above documentation for accuracy and completeness, and I agree with the above.

## 2018-12-08 ENCOUNTER — Other Ambulatory Visit: Payer: Self-pay

## 2018-12-08 ENCOUNTER — Encounter: Payer: Self-pay | Admitting: Physical Therapy

## 2018-12-08 ENCOUNTER — Ambulatory Visit: Payer: Medicare Other | Attending: Oncology | Admitting: Physical Therapy

## 2018-12-08 DIAGNOSIS — M6281 Muscle weakness (generalized): Secondary | ICD-10-CM | POA: Diagnosis not present

## 2018-12-08 DIAGNOSIS — C50211 Malignant neoplasm of upper-inner quadrant of right female breast: Secondary | ICD-10-CM | POA: Diagnosis not present

## 2018-12-08 DIAGNOSIS — Z170 Estrogen receptor positive status [ER+]: Secondary | ICD-10-CM | POA: Diagnosis not present

## 2018-12-08 DIAGNOSIS — R293 Abnormal posture: Secondary | ICD-10-CM | POA: Diagnosis not present

## 2018-12-08 NOTE — Therapy (Signed)
[facility] [address], [place], [place], [address] Phone: [phone]   Fax:  [phone]  Physical Therapy Evaluation  Patient Details  Name: Jessica Ingram MRN: [id number] Date of Birth: December 13, 1950 Referring Provider (PT): Dr. Lurline Del   Encounter Date: 12/08/2018  PT End of Session - 12/08/18 1312    Visit Number  1    Date for PT Re-Evaluation  01/05/19    PT Start Time  1230    PT Stop Time  1310    PT Time Calculation (min)  40 min    Activity Tolerance  Patient tolerated treatment well    Behavior During Therapy  [facility] for tasks assessed/performed       Past Medical History:  Diagnosis Date  . Arthritis   . Genetic testing 02/19/2017   Ms. Parham underwent genetic counseling and testing for hereditary cancer syndromes on 02/02/2017. Her results were negative for mutations in all 46 genes analyzed by [facility]'s 46-gene Common Hereditary Cancers Panel. Genes analyzed include: APC, ATM, AXIN2, BARD1, BMPR1A, BRCA1, BRCA2, BRIP1, CDH1, CDKN2A, CHEK2, CTNNA1, DICER1, EPCAM, GREM1, HOXB13, KIT, MEN1, MLH1, MSH2, MSH3, MSH6, MUTYH, NBN,  . Hepatitis    at age 89  . Personal history of radiation therapy     Past Surgical History:  Procedure Laterality Date  . ANTERIOR CRUCIATE LIGAMENT REPAIR Left   . AXILLARY SENTINEL NODE BIOPSY Left 03/29/2017   Procedure: LEFT AXILLARY SENTINEL LYMPH NODE BIOPSY, INJECT BLUE DYE LEFT BREAST;  Surgeon: Fanny Skates, MD;  Location: [place];  Service: General;  Laterality: Left;  . BREAST LUMPECTOMY Bilateral   . BREAST LUMPECTOMY WITH RADIOACTIVE SEED AND SENTINEL LYMPH NODE BIOPSY Bilateral 03/05/2017   Procedure: BILATERAL BREAST LUMPECTOMY WITH RADIOACTIVE SEED AND RIGHT AXILLARY SENTINEL LYMPH NODE BIOPSY;  Surgeon: Fanny Skates, MD;  Location: [place];  Service: General;  Laterality: Bilateral;  . FINE NEEDLE ASPIRATION Right  03/29/2017   Procedure: ASPIRATION OF SEROMA RIGHT AXILLA;  Surgeon: Fanny Skates, MD;  Location: [place];  Service: General;  Laterality: Right;    There were no vitals filed for this visit.   Subjective Assessment - 12/08/18 1236    Subjective  Patient started Arimidex 06/2017 and has vaginal dryness. Patient is achy all over. Patient feel weaker.     Patient Stated Goals  reduce vaginal dryness; reduce joint pain and be strong    Currently in Pain?  Yes    Pain Score  3     Pain Location  --   all over   Pain Orientation  Other (Comment)   joint pain   Pain Descriptors / Indicators  Aching    Pain Type  Chronic pain    Pain Onset  More than a month ago    Pain Frequency  Constant    Aggravating Factors   spin class,     Pain Relieving Factors  nothing    Multiple Pain Sites  No         [facility] PT Assessment - 12/08/18 0001      Assessment   Medical Diagnosis  C50.211, Z17.0 malignant neoplasm of upper-inner quadrant of right breast in female, estrogen receptor; M50.212, Z17.0 Malignant neoplasm of upper-inner quadrant of left breast in female , estrogen receptor positive    Referring Provider (PT)  Dr. Sarajane Jews Magrinat    Onset Date/Surgical Date  01/11/17    Prior Therapy  none  Precautions   Precautions  Other (comment)    Precaution Comments  cancer      Restrictions   Weight Bearing Restrictions  No      Balance Screen   Has the patient fallen in the past 6 months  No    Has the patient had a decrease in activity level because of a fear of falling?   No    Is the patient reluctant to leave their home because of a fear of falling?   No      Home Film/video editor residence      Prior Function   Level of Independence  Independent    Vocation  Part time employment    Vocation Requirements  sitting    Leisure  spin, walking dog daily, club fitness, leg press,       Cognition   Overall Cognitive Status  Within  Functional Limits for tasks assessed      Posture/Postural Control   Posture/Postural Control  No significant limitations      ROM / Strength   AROM / PROM / Strength  AROM;PROM;Strength      AROM   Lumbar Extension  decreased by 50%      Strength   Right Elbow Flexion  4/5    Right Elbow Extension  4/5    Left Elbow Flexion  4/5    Left Elbow Extension  4/5    Right Hip Extension  4/5    Right Hip ABduction  4/5                Objective measurements completed on examination: See above findings.    Pelvic Floor Special Questions - 12/08/18 0001    Currently Sexually Active  No    Urinary Leakage  No    Fecal incontinence  No    Skin Integrity  Intact   dry   Pelvic Floor Internal Exam  Patient confirms identification and approves PT to assess the pelvic floor and treatment    Exam Type  Vaginal    Palpation  no tenderness    Strength  fair squeeze, definite lift               PT Education - 12/08/18 1311    Education Details  information on vaginal moisturizers    Person(s) Educated  Patient    Methods  Explanation;Handout    Comprehension  Verbalized understanding          PT Long Term Goals - 12/08/18 1358      PT LONG TERM GOAL #1   Title  independent with HEP     Time  4    Period  Weeks    Status  New    Target Date  01/05/19      PT LONG TERM GOAL #2   Title  understand how to exercise at the gym to increase her overall strength in a safe way    Time  4    Period  Weeks    Status  New    Target Date  01/05/19      PT LONG TERM GOAL #3   Title  understand how to care for her perineum and reduce the dryness    Time  4    Period  Weeks    Status  New    Target Date  01/05/19             Plan - 12/08/18 1355  Clinical Impression Statement  Patient is a 68 year old female with vagina dryness and achy joints since she has had breast cancer and taking Arimidex. Patient reports pain level 3/10 throughout her joints.  Patient pelvic floor strength is 3/5 and has vaginal dryness. Patient shoulders, elbow, and hip strength is 4/5. Patient reports she is feeling weak and declining strength since she has been taking medication. Patient will benefit from skilled therapy to educate on vaginal health and how to exercise at the gym to keep her strength and reducing her tightness.     History and Personal Factors relevant to plan of care:  Breast cancer with radiation 01/11/2017    Clinical Presentation  Stable    Clinical Presentation due to:  stable condition    Clinical Decision Making  Low    Rehab Potential  Excellent    Clinical Impairments Affecting Rehab Potential  Breast cancer with radiation 01/11/2017    PT Frequency  1x / week    PT Duration  4 weeks    PT Treatment/Interventions  Therapeutic activities;Therapeutic exercise;Neuromuscular re-education;Manual techniques;Patient/family education    PT Next Visit Plan  education on vaginal health, stretches for hips and arms, gym exercises    Consulted and Agree with Plan of Care  Patient       Patient will benefit from skilled therapeutic intervention in order to improve the following deficits and impairments:  Pain, Decreased strength  Visit Diagnosis: Muscle weakness (generalized) - Plan: PT plan of care cert/re-cert  Carcinoma of upper-inner quadrant of right breast in female, estrogen receptor positive ([place]) - Plan: PT plan of care cert/re-cert     Problem List Patient Active Problem List   Diagnosis Date Noted  . Travel advice encounter 07/22/2018  . Dense breasts 05/02/2018  . Malignant neoplasm of upper-inner quadrant of left breast in female, estrogen receptor positive ([place]) 04/15/2017  . Genetic testing 02/19/2017  . Malignant neoplasm of upper-inner quadrant of right breast in female, estrogen receptor positive ([place]) 01/15/2017  . LEG PAIN 02/11/2010    Earlie Counts, PT 12/08/18 2:01 PM   [facility] [address], [place], [place], [address] Phone: [phone]   Fax:  [phone]  Name: CALLEEN ALVIS MRN: [id number] Date of Birth: 02/10/1951

## 2018-12-08 NOTE — Patient Instructions (Signed)
Moisturizers . They are used in the vagina to hydrate the mucous membrane that make up the vaginal canal. . Designed to keep a more normal acid balance (ph) . Once placed in the vagina, it will last between two to three days.  . Use 2-3 times per week at bedtime and last longer than 60 min. . Ingredients to avoid is glycerin and fragrance, can increase chance of infection . Should not be used just before sex due to causing irritation . Most are gels administered either in a tampon-shaped applicator or as a vaginal suppository. They are non-hormonal.   Types of Moisturizers . Samul Dada- drug store . Vitamin E vaginal suppositories- Whole foods, Amazon . Moist Again . Coconut oil- can break down condoms . Julva- (Do no use if on Tamoxifen) amazon . Yes moisturizer- amazon . NeuEve Silk , NeuEve Silver for menopausal or over 65 (if have severe vaginal atrophy or cancer treatments use NeuEve Silk for  1 month than move to [employer])- [employer], MapleFlower.dk . Olive and Bee intimate cream- www.oliveandbee.com.au . Mae vaginal moisturizer- Amazon  Creams to use externally on the Vulva area  Albertson's (good for for cancer patients that had radiation to the area)- [place] or [employer].FlyingBasics.com.br  V-magic cream - amazon  Julva-amazon  Vital "V Wild Yam salve ( help moisturize and help with thinning vulvar area, does have Keota by Damiva labial moisturizer ([place],    Things to avoid in the vaginal area . Do not use things to irritate the vulvar area . No lotions just specialized creams for the vulva area- Neogyn, V-magic, No soaps; can use Aveeno or Calendula cleanser if needed. Must be gentle . No deodorants . No douches . Good to sleep without underwear to let the vaginal area to air out . No scrubbing: spread the lips to let warm water rinse over labias and pat dry [facility] [address] [place], [place] [address] Phone # [phone] Fax [phone]

## 2018-12-15 ENCOUNTER — Encounter: Payer: Self-pay | Admitting: Physical Therapy

## 2018-12-15 ENCOUNTER — Ambulatory Visit: Payer: Medicare Other | Admitting: Physical Therapy

## 2018-12-15 DIAGNOSIS — Z170 Estrogen receptor positive status [ER+]: Secondary | ICD-10-CM | POA: Diagnosis not present

## 2018-12-15 DIAGNOSIS — M6281 Muscle weakness (generalized): Secondary | ICD-10-CM

## 2018-12-15 DIAGNOSIS — C50211 Malignant neoplasm of upper-inner quadrant of right female breast: Secondary | ICD-10-CM

## 2018-12-15 DIAGNOSIS — R293 Abnormal posture: Secondary | ICD-10-CM

## 2018-12-15 NOTE — Therapy (Signed)
[facility] [address], [place], [place], [address] Phone: [phone]   Fax:  [phone]  Physical Therapy Treatment  Patient Details  Name: Jessica Ingram MRN: [id number] Date of Birth: June 20, 1951 Referring Provider (PT): Dr. Lurline Del   Encounter Date: 12/15/2018  PT End of Session - 12/15/18 1613    Visit Number  2    Date for PT Re-Evaluation  01/05/19    Authorization Type  Medicare BCBS supplement    PT Start Time  1530    PT Stop Time  1610    PT Time Calculation (min)  40 min    Activity Tolerance  Patient tolerated treatment well;No increased pain    Behavior During Therapy  WFL for tasks assessed/performed       Past Medical History:  Diagnosis Date  . Arthritis   . Genetic testing 02/19/2017   Ms. Friesz underwent genetic counseling and testing for hereditary cancer syndromes on 02/02/2017. Her results were negative for mutations in all 46 genes analyzed by [facility]'s 46-gene Common Hereditary Cancers Panel. Genes analyzed include: APC, ATM, AXIN2, BARD1, BMPR1A, BRCA1, BRCA2, BRIP1, CDH1, CDKN2A, CHEK2, CTNNA1, DICER1, EPCAM, GREM1, HOXB13, KIT, MEN1, MLH1, MSH2, MSH3, MSH6, MUTYH, NBN,  . Hepatitis    at age 66  . Personal history of radiation therapy     Past Surgical History:  Procedure Laterality Date  . ANTERIOR CRUCIATE LIGAMENT REPAIR Left   . AXILLARY SENTINEL NODE BIOPSY Left 03/29/2017   Procedure: LEFT AXILLARY SENTINEL LYMPH NODE BIOPSY, INJECT BLUE DYE LEFT BREAST;  Surgeon: Fanny Skates, MD;  Location: [place];  Service: General;  Laterality: Left;  . BREAST LUMPECTOMY Bilateral   . BREAST LUMPECTOMY WITH RADIOACTIVE SEED AND SENTINEL LYMPH NODE BIOPSY Bilateral 03/05/2017   Procedure: BILATERAL BREAST LUMPECTOMY WITH RADIOACTIVE SEED AND RIGHT AXILLARY SENTINEL LYMPH NODE BIOPSY;  Surgeon: Fanny Skates, MD;  Location: [place];  Service:  General;  Laterality: Bilateral;  . FINE NEEDLE ASPIRATION Right 03/29/2017   Procedure: ASPIRATION OF SEROMA RIGHT AXILLA;  Surgeon: Fanny Skates, MD;  Location: [place];  Service: General;  Laterality: Right;    There were no vitals filed for this visit.  Subjective Assessment - 12/15/18 1534    Subjective  I am doing well. I go to [employer] for [employer]. When I wake up in the morning and sleep on my side I am achy.     Patient Stated Goals  reduce vaginal dryness; reduce joint pain and be strong    Currently in Pain?  No/denies       Access Code: [email or web address]  URL: [email or web address]  Date: 12/15/2018  Prepared by: Earlie Counts   Exercises  Standing Plantar Fascia Mobilization with Small Ball - 10 reps - 1 sets - 1x daily - 7x weekly  Curl Up with Arms Crossed - 10 reps - 3 sets - 1x daily - 7x weekly  Figure 4 Oblique Curl Up - 10 reps - 3 sets - 1x daily - 7x weekly  Reverse Crunch with Mini Swiss Ball - 5 reps - 1 sets - 1x daily - 7x weekly  Reverse Crunch - 10 reps - 1x daily - 7x weekly  Praying Mantis - 10 reps - 1 sets - 1x daily - 7x weekly  Standard Plank - 3 reps - 1 sets - 30 sec hold - 1x daily - 7x weekly  Side Plank on Knees - 3 reps -  1 sets - 30 sec hold - 1x daily - 7x weekly  Full Leg Press - 10 reps - 3 sets - 1x daily - 7x weekly  Patient Education  Sleep Positions  Plantar Fasciitis                 [facility] Adult PT Treatment/Exercise - 12/15/18 0001      Therapeutic Activites    Therapeutic Activities  Other Therapeutic Activities    Other Therapeutic Activities  sleeping on side with pillow to decrease hip pain; how to position her neck and shoulders during spin class      Lumbar Exercises: Aerobic   Nustep  no arms; seat #7 level 4 ; 6 min             PT Education - 12/15/18 1613    Education Details  Access Code: [email or web address]     Person(s) Educated  Patient    Methods   Explanation;Demonstration;Verbal cues;Handout    Comprehension  Returned demonstration;Verbalized understanding          PT Long Term Goals - 12/15/18 1626      PT LONG TERM GOAL #1   Title  independent with HEP     Time  4    Period  Weeks    Status  On-going      PT LONG TERM GOAL #2   Title  understand how to exercise at the gym to increase her overall strength in a safe way    Time  4    Period  Weeks    Status  On-going      PT LONG TERM GOAL #3   Title  understand how to care for her perineum and reduce the dryness    Time  4    Period  Weeks    Status  On-going            Plan - 12/15/18 1614    Clinical Impression Statement  Patient learned how to engage her pelvic floor with her core exercises and to decrease strain on her neck. Patient was pulling on her neck with her abdominal workout. Patient was educated on sleeping on her side with pillows between knees to decrease strain on her hips. Patient had no pain today. after her exercises. Patient will benefit from skilled therapy to educate on vaginal health and how to exercise at the gym to keep her strength and reducing her tightness.     Rehab Potential  Excellent    Clinical Impairments Affecting Rehab Potential  Breast cancer with radiation 01/11/2017    PT Frequency  1x / week    PT Duration  4 weeks    PT Treatment/Interventions  Therapeutic activities;Therapeutic exercise;Neuromuscular re-education;Manual techniques;Patient/family education    PT Next Visit Plan  education on vaginal health, stretches for hips and arms, gym exercises    PT Home Exercise Plan  Access Code: [email or web address]     Recommended Other Services  MD signed the initial eval    Consulted and Agree with Plan of Care  Patient       Patient will benefit from skilled therapeutic intervention in order to improve the following deficits and impairments:  Pain, Decreased strength  Visit Diagnosis: Muscle weakness (generalized)  Carcinoma of  upper-inner quadrant of right breast in female, estrogen receptor positive ([place])  Abnormal posture     Problem List Patient Active Problem List   Diagnosis Date Noted  . Travel advice encounter 07/22/2018  . Dense breasts  05/02/2018  . Malignant neoplasm of upper-inner quadrant of left breast in female, estrogen receptor positive ([place]) 04/15/2017  . Genetic testing 02/19/2017  . Malignant neoplasm of upper-inner quadrant of right breast in female, estrogen receptor positive ([place]) 01/15/2017  . LEG PAIN 02/11/2010    Earlie Counts, PT 12/15/18 4:27 PM    [facility] [address], [place], [place], [address] Phone: [phone]   Fax:  [phone]  Name: Jessica Ingram MRN: [id number] Date of Birth: Jul 12, 1951

## 2018-12-15 NOTE — Patient Instructions (Signed)
Access Code: [email or web address]  URL: [email or web address]  Date: 12/15/2018  Prepared by: Earlie Counts   Exercises  Standing Plantar Fascia Mobilization with Small Ball - 10 reps - 1 sets - 1x daily - 7x weekly  Curl Up with Arms Crossed - 10 reps - 3 sets - 1x daily - 7x weekly  Figure 4 Oblique Curl Up - 10 reps - 3 sets - 1x daily - 7x weekly  Reverse Crunch with Mini Swiss Ball - 5 reps - 1 sets - 1x daily - 7x weekly  Reverse Crunch - 10 reps - 1x daily - 7x weekly  Praying Mantis - 10 reps - 1 sets - 1x daily - 7x weekly  Standard Plank - 3 reps - 1 sets - 30 sec hold - 1x daily - 7x weekly  Side Plank on Knees - 3 reps - 1 sets - 30 sec hold - 1x daily - 7x weekly  Full Leg Press - 10 reps - 3 sets - 1x daily - 7x weekly  Patient Education  Sleep Positions  Plantar Fasciitis [facility] [address] [place], [place] [address] Phone # [phone] Fax [phone]

## 2018-12-22 ENCOUNTER — Encounter: Payer: Self-pay | Admitting: Physical Therapy

## 2018-12-22 ENCOUNTER — Ambulatory Visit: Payer: Medicare Other | Admitting: Physical Therapy

## 2018-12-22 DIAGNOSIS — Z170 Estrogen receptor positive status [ER+]: Secondary | ICD-10-CM | POA: Diagnosis not present

## 2018-12-22 DIAGNOSIS — M6281 Muscle weakness (generalized): Secondary | ICD-10-CM | POA: Diagnosis not present

## 2018-12-22 DIAGNOSIS — R293 Abnormal posture: Secondary | ICD-10-CM

## 2018-12-22 DIAGNOSIS — C50211 Malignant neoplasm of upper-inner quadrant of right female breast: Secondary | ICD-10-CM

## 2018-12-22 NOTE — Patient Instructions (Signed)
Access Code: [email or web address]  URL: [email or web address]  Date: 12/22/2018  Prepared by: Earlie Counts   Exercises  Standing Plantar Fascia Mobilization with Small Ball - 10 reps - 1 sets - 1x daily - 7x weekly  Curl Up with Arms Crossed - 10 reps - 3 sets - 1x daily - 7x weekly  Figure 4 Oblique Curl Up - 10 reps - 3 sets - 1x daily - 7x weekly  Reverse Crunch with Mini Swiss Ball - 5 reps - 1 sets - 1x daily - 7x weekly  Reverse Crunch - 10 reps - 1x daily - 7x weekly  Praying Mantis - 10 reps - 1 sets - 1x daily - 7x weekly  Standard Plank - 3 reps - 1 sets - 30 sec hold - 1x daily - 7x weekly  Side Plank on Knees - 3 reps - 1 sets - 30 sec hold - 1x daily - 7x weekly  Full Leg Press - 10 reps - 3 sets - 1x daily - 7x weekly  Shoulder Overhead Press in Abduction with Dumbbells - 10 reps - 2 sets - 1x daily - 7x weekly  Standing Pronated Elbow Flexion with Dumbbell - 10 reps - 1 sets - 1x daily - 7x weekly  Standing Bicep Curls Supinated with Dumbbells - 10 reps - 1 sets - 1x daily - 7x weekly  Standing Bicep Curls Neutral with Dumbbells - 10 reps - 1 sets - 1x daily - 7x weekly  Bent Over Tricep Extension with Counter Support - 10 reps - 1 sets - 1x daily - 7x weekly  Forward T with Fly - 10 reps - 1 sets - 1x daily - 7x weekly  Standing Upright Shoulder Row with Barbell - 10 reps - 1 sets - 1x daily - 7x weekly  Shoulder Abduction with Dumbbells - Palms Down - 10 reps - 1 sets - 1x daily - 7x weekly  Prone Middle Trapezius Strengthening on Swiss Ball - 10 reps - 1 sets - 1x daily - 7x weekly  Prone Lower Trapezius Strengthening on Swiss Ball - 10 reps - 1 sets - 1x daily - 7x weekly  Supine Hamstring Stretch with Strap - 2 reps - 1 sets - 30 sec hold - 1x daily - 7x weekly  Hip Adductors and Hamstring Stretch with Strap - 2 reps - 1 sets - 30 sec hold - 1x daily - 7x weekly  Supine Piriformis Stretch Pulling Heel to Hip - 2 reps - 1 sets - 30 sec hold - 1x daily - 7x weekly   Gastroc Stretch on Wall - 1 reps - 1 sets - 30 sec hold - 1x daily - 7x weekly  Standing Overhead Triceps Stretch - 2 reps - 1 sets - 30 sec hold - 1x daily - 7x weekly  Downward Dog - 1 reps - 1 sets - 30 sec hold - 1x daily - 7x weekly  Patient Education  Sleep Positions  Plantar Fasciitis [facility] [address] [place], [place] [address] Phone # [phone] Fax [phone]

## 2018-12-22 NOTE — Therapy (Signed)
[facility] [address] [place], [place], [address] Phone: [phone]   Fax:  [phone]  Physical Therapy Treatment  Patient Details  Name: Jessica Ingram MRN: [id number] Date of Birth: July 10, 1951 Referring Provider (PT): Dr. Lurline Del   Encounter Date: 12/22/2018  PT End of Session - 12/22/18 1613    Visit Number  3    Date for PT Re-Evaluation  01/05/19    Authorization Type  Medicare BCBS supplement    PT Start Time  0174    PT Stop Time  9449    PT Time Calculation (min)  44 min    Activity Tolerance  Patient tolerated treatment well;No increased pain    Behavior During Therapy  WFL for tasks assessed/performed       Past Medical History:  Diagnosis Date  . Arthritis   . Genetic testing 02/19/2017   Ms. Bratz underwent genetic counseling and testing for hereditary cancer syndromes on 02/02/2017. Her results were negative for mutations in all 46 genes analyzed by [facility]'s 46-gene Common Hereditary Cancers Panel. Genes analyzed include: APC, ATM, AXIN2, BARD1, BMPR1A, BRCA1, BRCA2, BRIP1, CDH1, CDKN2A, CHEK2, CTNNA1, DICER1, EPCAM, GREM1, HOXB13, KIT, MEN1, MLH1, MSH2, MSH3, MSH6, MUTYH, NBN,  . Hepatitis    at age 62  . Personal history of radiation therapy     Past Surgical History:  Procedure Laterality Date  . ANTERIOR CRUCIATE LIGAMENT REPAIR Left   . AXILLARY SENTINEL NODE BIOPSY Left 03/29/2017   Procedure: LEFT AXILLARY SENTINEL LYMPH NODE BIOPSY, INJECT BLUE DYE LEFT BREAST;  Surgeon: Fanny Skates, MD;  Location: [place];  Service: General;  Laterality: Left;  . BREAST LUMPECTOMY Bilateral   . BREAST LUMPECTOMY WITH RADIOACTIVE SEED AND SENTINEL LYMPH NODE BIOPSY Bilateral 03/05/2017   Procedure: BILATERAL BREAST LUMPECTOMY WITH RADIOACTIVE SEED AND RIGHT AXILLARY SENTINEL LYMPH NODE BIOPSY;  Surgeon: Fanny Skates, MD;  Location: [place];  Service:  General;  Laterality: Bilateral;  . FINE NEEDLE ASPIRATION Right 03/29/2017   Procedure: ASPIRATION OF SEROMA RIGHT AXILLA;  Surgeon: Fanny Skates, MD;  Location: [place];  Service: General;  Laterality: Right;    There were no vitals filed for this visit.  Subjective Assessment - 12/22/18 1542    Subjective  I am happy with the last session. I want to learn exercises for overhead and stretches.     Patient Stated Goals  reduce vaginal dryness; reduce joint pain and be strong    Currently in Pain?  No/denies         [facility] PT Assessment - 12/22/18 0001      Assessment   Medical Diagnosis  C50.211, Z17.0 malignant neoplasm of upper-inner quadrant of right breast in female, estrogen receptor; M50.212, Z17.0 Malignant neoplasm of upper-inner quadrant of left breast in female , estrogen receptor positive    Referring Provider (PT)  Dr. Sarajane Jews Magrinat    Onset Date/Surgical Date  01/11/17    Prior Therapy  none      Precautions   Precautions  Other (comment)    Precaution Comments  cancer      Restrictions   Weight Bearing Restrictions  No      [place] residence      Prior Function   Level of Independence  Independent    Vocation  Part time employment    Vocation Requirements  sitting    Leisure  spin, walking dog  daily, club fitness, leg press,       Cognition   Overall Cognitive Status  Within Functional Limits for tasks assessed      Posture/Postural Control   Posture/Postural Control  No significant limitations      AROM   Lumbar Extension  decreased by 50%      Strength   Right Elbow Flexion  4/5    Right Elbow Extension  4/5    Left Elbow Flexion  4/5    Left Elbow Extension  4/5    Right Hip Extension  4/5    Right Hip ABduction  4/5       Access Code: [email or web address]  URL: [email or web address]  Date: 12/22/2018  Prepared by: Earlie Counts   Exercises  Standing Plantar Fascia Mobilization  with Small Ball - 10 reps - 1 sets - 1x daily - 7x weekly  Curl Up with Arms Crossed - 10 reps - 3 sets - 1x daily - 7x weekly  Figure 4 Oblique Curl Up - 10 reps - 3 sets - 1x daily - 7x weekly  Reverse Crunch with Mini Swiss Ball - 5 reps - 1 sets - 1x daily - 7x weekly  Reverse Crunch - 10 reps - 1x daily - 7x weekly  Praying Mantis - 10 reps - 1 sets - 1x daily - 7x weekly  Standard Plank - 3 reps - 1 sets - 30 sec hold - 1x daily - 7x weekly  Side Plank on Knees - 3 reps - 1 sets - 30 sec hold - 1x daily - 7x weekly  Full Leg Press - 10 reps - 3 sets - 1x daily - 7x weekly  Shoulder Overhead Press in Abduction with Dumbbells - 10 reps - 2 sets - 1x daily - 7x weekly  Standing Pronated Elbow Flexion with Dumbbell - 10 reps - 1 sets - 1x daily - 7x weekly  Standing Bicep Curls Supinated with Dumbbells - 10 reps - 1 sets - 1x daily - 7x weekly  Standing Bicep Curls Neutral with Dumbbells - 10 reps - 1 sets - 1x daily - 7x weekly  Bent Over Tricep Extension with Counter Support - 10 reps - 1 sets - 1x daily - 7x weekly  Forward T with Fly - 10 reps - 1 sets - 1x daily - 7x weekly  Standing Upright Shoulder Row with Barbell - 10 reps - 1 sets - 1x daily - 7x weekly  Shoulder Abduction with Dumbbells - Palms Down - 10 reps - 1 sets - 1x daily - 7x weekly  Prone Middle Trapezius Strengthening on Swiss Ball - 10 reps - 1 sets - 1x daily - 7x weekly  Prone Lower Trapezius Strengthening on Swiss Ball - 10 reps - 1 sets - 1x daily - 7x weekly  Supine Hamstring Stretch with Strap - 2 reps - 1 sets - 30 sec hold - 1x daily - 7x weekly  Hip Adductors and Hamstring Stretch with Strap - 2 reps - 1 sets - 30 sec hold - 1x daily - 7x weekly  Supine Piriformis Stretch Pulling Heel to Hip - 2 reps - 1 sets - 30 sec hold - 1x daily - 7x weekly  Gastroc Stretch on Wall - 1 reps - 1 sets - 30 sec hold - 1x daily - 7x weekly  Standing Overhead Triceps Stretch - 2 reps - 1 sets - 30 sec hold - 1x daily - 7x  weekly  Downward Dog - 1 reps -  1 sets - 30 sec hold - 1x daily - 7x weekly  Patient Education  Sleep Positions  Plantar Fasciitis              [facility] Adult PT Treatment/Exercise - 12/22/18 0001      Lumbar Exercises: Aerobic   Nustep  no arms; seat #7 level 4 ; 6 min             PT Education - 12/22/18 1615    Education Details  Access Code: [email or web address]     Person(s) Educated  Patient    Methods  Explanation;Demonstration;Verbal cues;Handout    Comprehension  Verbalized understanding;Returned demonstration          PT Long Term Goals - 12/22/18 1614      PT LONG TERM GOAL #1   Title  independent with HEP     Time  4    Period  Weeks    Status  Achieved      PT LONG TERM GOAL #2   Title  understand how to exercise at the gym to increase her overall strength in a safe way    Time  4    Period  Weeks    Status  Achieved      PT LONG TERM GOAL #3   Title  understand how to care for her perineum and reduce the dryness    Time  4    Period  Weeks    Status  Achieved            Plan - 12/22/18 1637    Clinical Impression Statement  Patient has met her goals. Patient understands how to exercise at the gym with correct body mechanics. Patient understands how to moisturize the vaginal area to improve vaginal health. Patient understands the importance of postural strength to reduce strain on back. Patient understands how weightbearing exercise will improve bone health. Patient is ready for discharge.     Rehab Potential  Excellent    Clinical Impairments Affecting Rehab Potential  Breast cancer with radiation 01/11/2017    PT Treatment/Interventions  Therapeutic activities;Therapeutic exercise;Neuromuscular re-education;Manual techniques;Patient/family education    PT Next Visit Plan  Discharge to HEP this visit    PT Home Exercise Plan  Access Code: [email or web address]     Consulted and Agree with Plan of Care  Patient       Patient will benefit from skilled  therapeutic intervention in order to improve the following deficits and impairments:  Pain, Decreased strength  Visit Diagnosis: Muscle weakness (generalized)  Carcinoma of upper-inner quadrant of right breast in female, estrogen receptor positive ([place])  Abnormal posture     Problem List Patient Active Problem List   Diagnosis Date Noted  . Travel advice encounter 07/22/2018  . Dense breasts 05/02/2018  . Malignant neoplasm of upper-inner quadrant of left breast in female, estrogen receptor positive ([place]) 04/15/2017  . Genetic testing 02/19/2017  . Malignant neoplasm of upper-inner quadrant of right breast in female, estrogen receptor positive ([place]) 01/15/2017  . LEG PAIN 02/11/2010    Earlie Counts, PT 12/22/18 4:48 PM   [facility] [address] [place], [place], [address] Phone: [phone]   Fax:  [phone]  Name: IVALENE PLATTE MRN: [id number] Date of Birth: 03-03-1951  PHYSICAL THERAPY DISCHARGE SUMMARY  Visits from Start of Care: 3  Current functional level related to goals / functional outcomes: See above.    Remaining deficits: See above.  Education / Equipment: HEP Plan: Patient agrees to discharge.  Patient goals were not met. Patient is being discharged due to meeting the stated rehab goals. Thank you for the referral. Earlie Counts, PT 12/22/18 4:48 PM   ?????

## 2018-12-29 ENCOUNTER — Encounter: Payer: Medicare Other | Admitting: Physical Therapy

## 2019-04-26 ENCOUNTER — Other Ambulatory Visit: Payer: Self-pay

## 2019-04-26 DIAGNOSIS — Z170 Estrogen receptor positive status [ER+]: Secondary | ICD-10-CM

## 2019-04-26 DIAGNOSIS — C50211 Malignant neoplasm of upper-inner quadrant of right female breast: Secondary | ICD-10-CM

## 2019-05-03 ENCOUNTER — Telehealth: Payer: Self-pay

## 2019-05-03 ENCOUNTER — Other Ambulatory Visit: Payer: Self-pay

## 2019-05-03 DIAGNOSIS — C50211 Malignant neoplasm of upper-inner quadrant of right female breast: Secondary | ICD-10-CM

## 2019-05-03 DIAGNOSIS — C50212 Malignant neoplasm of upper-inner quadrant of left female breast: Secondary | ICD-10-CM

## 2019-05-03 DIAGNOSIS — Z170 Estrogen receptor positive status [ER+]: Secondary | ICD-10-CM

## 2019-05-03 NOTE — Telephone Encounter (Signed)
Pt called to report when she called [facility] to schedule her breast MRI she was told she would need to have her yearly mammogram first. Pt's last mammogram was May 2019. Order has been placed for mammogram. Pt will call [facility] to schedule mammo and MRI

## 2019-05-29 ENCOUNTER — Ambulatory Visit
Admission: RE | Admit: 2019-05-29 | Discharge: 2019-05-29 | Disposition: A | Discharge status: Home / Self Care | Payer: Medicare Other | Source: Ambulatory Visit | Attending: Oncology | Admitting: Oncology

## 2019-05-29 ENCOUNTER — Other Ambulatory Visit: Payer: Self-pay

## 2019-05-29 DIAGNOSIS — C50211 Malignant neoplasm of upper-inner quadrant of right female breast: Secondary | ICD-10-CM

## 2019-05-29 DIAGNOSIS — R922 Inconclusive mammogram: Secondary | ICD-10-CM | POA: Diagnosis not present

## 2019-05-29 DIAGNOSIS — Z170 Estrogen receptor positive status [ER+]: Secondary | ICD-10-CM

## 2019-05-29 DIAGNOSIS — C50212 Malignant neoplasm of upper-inner quadrant of left female breast: Secondary | ICD-10-CM

## 2019-09-11 DIAGNOSIS — Z23 Encounter for immunization: Secondary | ICD-10-CM | POA: Diagnosis not present

## 2019-09-20 ENCOUNTER — Other Ambulatory Visit: Payer: Self-pay | Admitting: Oncology

## 2019-09-20 DIAGNOSIS — Z170 Estrogen receptor positive status [ER+]: Secondary | ICD-10-CM

## 2019-09-20 DIAGNOSIS — C50211 Malignant neoplasm of upper-inner quadrant of right female breast: Secondary | ICD-10-CM

## 2019-09-20 DIAGNOSIS — C50212 Malignant neoplasm of upper-inner quadrant of left female breast: Secondary | ICD-10-CM

## 2019-09-25 ENCOUNTER — Other Ambulatory Visit: Payer: Medicare Other

## 2019-12-04 ENCOUNTER — Telehealth: Payer: Self-pay | Admitting: Oncology

## 2019-12-04 NOTE — Telephone Encounter (Signed)
Rescheduled per 2/8 sch msg, pt req. Called and spoke with pt, confirmed 2/22 appt  

## 2019-12-06 ENCOUNTER — Ambulatory Visit: Payer: Medicare Other | Admitting: Oncology

## 2019-12-06 ENCOUNTER — Other Ambulatory Visit: Payer: Medicare Other

## 2019-12-14 ENCOUNTER — Other Ambulatory Visit: Payer: Medicare Other

## 2019-12-17 NOTE — Progress Notes (Signed)
[place]  Telephone:([phone]) [phone] Fax:(336) [phone]     ID: Jessica Ingram DOB: 05/12/1951  MR#: [id number]  UDJ#:[id number]  Patient Care Team: Marda Stalker, PA-C as PCP - General (Family Medicine) Fanny Skates, MD as Consulting Physician (General Surgery) Shulem Mader, Virgie Dad, MD as Consulting Physician (Oncology) Kyung Rudd, MD as Consulting Physician (Radiation Oncology) Juanita Craver, MD as Consulting Physician (Gastroenterology) Sydnee Levans, MD as Consulting Physician (Dermatology) Azucena Fallen, MD as Consulting Physician (Obstetrics and Gynecology) Elsie Saas, MD as Consulting Physician (Orthopedic Surgery) Delice Bison, Charlestine Massed, NP as Nurse Practitioner (Hematology and Oncology) OTHER MD:  CHIEF COMPLAINT: Estrogen receptor positive breast cancer  CURRENT TREATMENT: Anastrozole   INTERVAL HISTORY: Jessica Ingram has been scheduled to see me 12/18/2019 but that appointment has been changed  The patient continues on anastrozole, which she tolerates well. She reports hot flashes that are "better than before." She also reports vaginal dryness.  Since her last visit, she underwent bilateral diagnostic mammography with tomography at [facility] on 05/29/2019 showing: breast density category C; no evidence of malignancy in either breast.  Given her breast density category C and bilateral breast cancers we had set her up for an MRI of the breast an order was in but the study was not performed.  She is scheduled for repeat bone density screening later this week, on 12/20/2019.   REVIEW OF SYSTEMS: Jessica Ingram    BREAST CANCER HISTORY: From the original intake note:  "Jessica Ingram" had bilateral screening mammography with tomography at [facility] 01/01/2017. The breast density was category D. In the right breast superiorly there was a 1.1 cm irregular mass. In the left breast there was an area of possible architectural distortion.  The patient was recalled  01/07/2017 or bilateral diagnostic mammography and bilateral ultrasonography. In the right breast superiorly there was a 1.5 cm irregular mass which was located by sonography in the upper inner quadrant area this measured 1.1 cm.  In the left breast the area of architectural distortion was again noted. By ultrasound this was an irregular mass with indistinct margins in the upper inner quadrant of the left breast.  Biopsy of both the areas in question was performed 01/11/2017. 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 The left breast biopsy showed only fibrocystic changes but this was felt to be discordant by radiology  The patient's subsequent history is as detailed below   PAST MEDICAL HISTORY: Past Medical History:  Diagnosis Date   Arthritis    Genetic testing 02/19/2017   Jessica Ingram underwent genetic counseling and testing for hereditary cancer syndromes on 02/02/2017. Her results were negative for mutations in all 46 genes analyzed by [facility]'s 46-gene Common Hereditary Cancers Panel. Genes analyzed include: APC, ATM, AXIN2, BARD1, BMPR1A, BRCA1, BRCA2, BRIP1, CDH1, CDKN2A, CHEK2, CTNNA1, DICER1, EPCAM, GREM1, HOXB13, KIT, MEN1, MLH1, MSH2, MSH3, MSH6, MUTYH, NBN,   Hepatitis    at age 72   Personal history of radiation therapy     PAST SURGICAL HISTORY: Past Surgical History:  Procedure Laterality Date   ANTERIOR CRUCIATE LIGAMENT REPAIR Left    AXILLARY SENTINEL NODE BIOPSY Left 03/29/2017   Procedure: LEFT AXILLARY SENTINEL LYMPH NODE BIOPSY, INJECT BLUE DYE LEFT BREAST;  Surgeon: Fanny Skates, MD;  Location: [place];  Service:  General;  Laterality: Left;   BREAST LUMPECTOMY Bilateral 03/05/2017   BREAST LUMPECTOMY WITH RADIOACTIVE  SEED AND SENTINEL LYMPH NODE BIOPSY Bilateral 03/05/2017   Procedure: BILATERAL BREAST LUMPECTOMY WITH RADIOACTIVE SEED AND RIGHT AXILLARY SENTINEL LYMPH NODE BIOPSY;  Surgeon: Fanny Skates, MD;  Location: [place];  Service: General;  Laterality: Bilateral;   FINE NEEDLE ASPIRATION Right 03/29/2017   Procedure: ASPIRATION OF SEROMA RIGHT AXILLA;  Surgeon: Fanny Skates, MD;  Location: [place];  Service: General;  Laterality: Right;    FAMILY HISTORY Family History  Problem Relation Age of Onset   Lung cancer Mother 44       d.93 history of smoking   Prostate cancer Father 52       d.83 prostate cancer metastasized to bone   Colon cancer Brother 76       d.53   Breast cancer Maternal Aunt 73       d.85s   Colon cancer Maternal Aunt 83   Bone cancer Paternal Aunt        d.85   Prostate cancer Paternal Uncle 19       d.72s metastasized to bladder   Kidney cancer Maternal Grandfather 81       d.53 possible colon cancer   Lung cancer Paternal Uncle 60       d.26   Brain cancer Cousin 78       d.11 paternal first-cousin. Son of uncle with prostate cancer.   Prostate cancer Paternal Grandfather        d.89  The patient's father was diagnosed with prostate cancer at the age of 3. He died from unrelated causes at age 68. The patient's mother was diagnosed with lung cancer at age 44. She was a smoker. She died from unrelated causes at age 66. The patient has one brother, 5 sisters. The brother was diagnosed with colon cancer at age 44 and died at age 14. There is also a maternal aunt diagnosed with both breast and colon cancers in her 17s. There is no history of ovarian cancer in the family   GYNECOLOGIC HISTORY:  No LMP recorded. Patient is postmenopausal. Menarche age 4, first live birth age 46, which increases the risk of breast cancer developing. The patient is GX P2. She went through the change of life age 72 and was on hormone replacement until  age 53. She also took oral contraceptives remotely for about 12 years with no complications   SOCIAL HISTORY:  Jessica Ingram is widowed; her husband died from complications of depression in September 2017. 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  Her daughter Jessica Ingram lives in [place] where she works as an Therapist, music, and daughter Jessica Ingram lives in [place] where she works for the baseball program at [employer].  Patient will be a grandmother to a baby girl in May 2020. She is not a Ambulance person.    ADVANCED DIRECTIVES:  in place; the patient has named her sister Jessica Ingram as healthcare power of attorney. Jessica Ingram may be reached at [phone]    HEALTH MAINTENANCE: Social History   Tobacco Use   Smoking status: Never Smoker   Smokeless tobacco: Never Used  Substance Use Topics   Alcohol use: Yes    Comment: 7   Drug use: No     Colonoscopy: January 2017/man  PAP: 2014  Bone  density: 08/2017, -0.8   No Known Allergies  Current Outpatient Medications  Medication Sig Dispense Refill   anastrozole (ARIMIDEX) 1 MG tablet Take 1 tablet (1 mg total) by mouth daily. 90 tablet 4   ondansetron (ZOFRAN) 4 MG tablet Take 1 tablet (4 mg total) by mouth every 8 (eight) hours as needed for nausea or vomiting. (Patient not taking: Reported on 12/08/2018) 8 tablet 0   No current facility-administered medications for this visit.    OBJECTIVE: Middle-aged white woman   There were no vitals filed for this visit.   There is no height or weight on file to calculate BMI.    ECOG FS:0 - Asymptomatic    LAB RESULTS:  CMP     Component Value Date/Time   NA 139 12/05/2018 1027   NA 137 09/21/2017 1054   K 4.7 12/05/2018 1027   K 4.4 09/21/2017 1054   CL 105 12/05/2018 1027   CO2 24 12/05/2018 1027   CO2 25 09/21/2017 1054   GLUCOSE 97 12/05/2018 1027   GLUCOSE 101 09/21/2017  1054   BUN 13 12/05/2018 1027   BUN 12.6 09/21/2017 1054   CREATININE 0.92 12/05/2018 1027   CREATININE 0.8 09/21/2017 1054   CALCIUM 9.7 12/05/2018 1027   CALCIUM 9.7 09/21/2017 1054   PROT 7.5 12/05/2018 1027   PROT 7.8 09/21/2017 1054   ALBUMIN 4.7 12/05/2018 1027   ALBUMIN 4.1 09/21/2017 1054   AST 20 12/05/2018 1027   AST 22 09/21/2017 1054   ALT 16 12/05/2018 1027   ALT 21 09/21/2017 1054   ALKPHOS 56 12/05/2018 1027   ALKPHOS 81 09/21/2017 1054   BILITOT 0.8 12/05/2018 1027   BILITOT 0.45 09/21/2017 1054   GFRNONAA >60 12/05/2018 1027   GFRAA >60 12/05/2018 1027    No results found for: Ronnald Ramp, A1GS, A2GS, BETS, BETA2SER, GAMS, MSPIKE, SPEI  No results found for: Nils Pyle, [facility]  Lab Results  Component Value Date   WBC 3.6 (L) 12/05/2018   NEUTROABS 2.1 12/05/2018   HGB 13.9 12/05/2018   HCT 41.5 12/05/2018   MCV 97.0 12/05/2018   PLT 153 12/05/2018      Chemistry      Component Value Date/Time   NA 139 12/05/2018 1027   NA 137 09/21/2017 1054   K 4.7 12/05/2018 1027   K 4.4 09/21/2017 1054   CL 105 12/05/2018 1027   CO2 24 12/05/2018 1027   CO2 25 09/21/2017 1054   BUN 13 12/05/2018 1027   BUN 12.6 09/21/2017 1054   CREATININE 0.92 12/05/2018 1027   CREATININE 0.8 09/21/2017 1054      Component Value Date/Time   CALCIUM 9.7 12/05/2018 1027   CALCIUM 9.7 09/21/2017 1054   ALKPHOS 56 12/05/2018 1027   ALKPHOS 81 09/21/2017 1054   AST 20 12/05/2018 1027   AST 22 09/21/2017 1054   ALT 16 12/05/2018 1027   ALT 21 09/21/2017 1054   BILITOT 0.8 12/05/2018 1027   BILITOT 0.45 09/21/2017 1054       No results found for: LABCA2  No components found for: [id number]  No results for input(s): INR in the last 168 hours.  Urinalysis No results found for: COLORURINE, APPEARANCEUR, LABSPEC, PHURINE, GLUCOSEU, HGBUR, BILIRUBINUR, KETONESUR, PROTEINUR, UROBILINOGEN, NITRITE, LEUKOCYTESUR   STUDIES: No results  found.   ELIGIBLE FOR AVAILABLE RESEARCH PROTOCOL: no  ASSESSMENT: 69 y.o. [place] woman status post right breast upper inner quadrant biopsy 01/11/2017 for a clinical  T1c N0, stage IA invasive  ductal carcinoma, grade 1, estrogen and progesterone receptor positive, HER-2 nonamplified, with an MIB-1 of 5%.   (1) Left lumpectomy and sentinel lymph node sampling 03/05/2017 showed a pT1c pN0 invasive ductal carcinoma, grade 1, with close but negative margins, estrogen receptor strongly positive, progesterone receptor negative, with an MIB-1 of 5% and no HER-2 amplification.  (a) left axillary sentinel lymph node biopsy 03/29/2017 showed no lymph node involvement  (2) right lumpectomy 03/05/2017 showed a pT1c pN0 invasive ductal carcinoma, grade 1, with negative margins  (3) Oncotype DX scores of 19-21 predicts a 10 year risk of recurrence outside the breast of 12-13% if the patient's only systemic therapy is tamoxifen for 5 years. It also predicts no benefit from chemotherapy.  (4) adjuvant radiation completed 05/26/2017  (5) started anastrozole 07/15/2017  (a) bone density at the [facility] 09/22/2017 showed a T score of -0.8  (b) bone density 12/20/2019  (6) genetics testing 02/15/2017 through [facility]'s Common Hereditary Cancers Panel showed no deleterious mutations in APC, ATM, AXIN2, BARD1, BMPR1A, BRCA1, BRCA2, BRIP1, CDH1, CDKN2A, CHEK2, CTNNA1, DICER1, EPCAM, GREM1, HOXB13, KIT, MEN1, MLH1, MSH2, MSH3, MSH6, MUTYH, NBN, NF1, NTHL1, PALB2, PDGFRA, PMS2, POLD1, POLE, PTEN, RAD50, RAD51C, RAD51D, SDHA, SDHB, SDHC, SDHD, SMAD4, SMARCA4, STK11, TP53, TSC1, TSC2, and VHL.   (a) Variants of uncertain significance (VUSs) were identified in three genes.    DICER1 c.2027G>C (p.Arg676Pro)   TSC2 c.4325A>T (p.Glu1442Val)   CTNNA1 c.410G>A (p.Arg137Gln)  (7) breast density category D  (a) yearly breast MRI scheduled for 03/15/2019   PLAN: Jessica Ingram appointment 12/18/2019 was  changed.  Jessica Ingram, Virgie Dad, MD  12/17/19 9:24 AM Medical Oncology and Hematology [facility] [place], [place] [address] Tel. [phone]    Fax. [phone]   I, Wilburn Mylar, am acting as scribe for Dr. Virgie Dad. Jessica Ingram.  I, Lurline Del MD, have reviewed the above documentation for accuracy and completeness, and I agree with the above.   *Total Encounter Time as defined by the Centers for Medicare and Medicaid Services includes, in addition to the face-to-face time of a patient visit (documented in the note above) non-face-to-face time: obtaining and reviewing outside history, ordering and reviewing medications, tests or procedures, care coordination (communications with other health care professionals or caregivers) and documentation in the medical record.

## 2019-12-18 ENCOUNTER — Inpatient Hospital Stay: Payer: Medicare Other

## 2019-12-18 ENCOUNTER — Other Ambulatory Visit: Payer: Self-pay | Admitting: Oncology

## 2019-12-18 ENCOUNTER — Inpatient Hospital Stay (HOSPITAL_BASED_OUTPATIENT_CLINIC_OR_DEPARTMENT_OTHER): Payer: Medicare Other | Admitting: Oncology

## 2019-12-18 ENCOUNTER — Telehealth: Payer: Self-pay | Admitting: Oncology

## 2019-12-18 DIAGNOSIS — C50211 Malignant neoplasm of upper-inner quadrant of right female breast: Secondary | ICD-10-CM

## 2019-12-18 DIAGNOSIS — C50212 Malignant neoplasm of upper-inner quadrant of left female breast: Secondary | ICD-10-CM

## 2019-12-18 DIAGNOSIS — Z170 Estrogen receptor positive status [ER+]: Secondary | ICD-10-CM

## 2019-12-18 NOTE — Telephone Encounter (Signed)
Scheduled appt per 2/22 sch message - pt aware of appt date and time

## 2019-12-20 ENCOUNTER — Ambulatory Visit
Admission: RE | Admit: 2019-12-20 | Discharge: 2019-12-20 | Disposition: A | Discharge status: Home / Self Care | Payer: Medicare Other | Source: Ambulatory Visit | Attending: Oncology | Admitting: Oncology

## 2019-12-20 ENCOUNTER — Other Ambulatory Visit: Payer: Self-pay

## 2019-12-20 DIAGNOSIS — C50211 Malignant neoplasm of upper-inner quadrant of right female breast: Secondary | ICD-10-CM

## 2019-12-20 DIAGNOSIS — C50212 Malignant neoplasm of upper-inner quadrant of left female breast: Secondary | ICD-10-CM

## 2019-12-20 DIAGNOSIS — Z13820 Encounter for screening for osteoporosis: Secondary | ICD-10-CM | POA: Diagnosis not present

## 2019-12-20 DIAGNOSIS — Z170 Estrogen receptor positive status [ER+]: Secondary | ICD-10-CM

## 2019-12-20 DIAGNOSIS — Z780 Asymptomatic menopausal state: Secondary | ICD-10-CM | POA: Diagnosis not present

## 2019-12-22 ENCOUNTER — Other Ambulatory Visit: Payer: Self-pay | Admitting: Oncology

## 2019-12-26 NOTE — Progress Notes (Signed)
[place]  [phone] [phone]     ID: Jessica Ingram DOB: 05-Dec-1950  MR#: [id number]  GGY#:[id number]  Patient Care Team: Jessica Stalker, PA-C as PCP - General (Family Medicine) Jessica Skates, Ingram as Consulting Physician (General Surgery) Ingram, Jessica Dad, Ingram as Consulting Physician (Oncology) Jessica Rudd, Ingram as Consulting Physician (Radiation Oncology) Jessica Craver, Ingram as Consulting Physician (Gastroenterology) Jessica Levans, Ingram as Consulting Physician (Dermatology) Jessica Fallen, Ingram as Consulting Physician (Obstetrics and Gynecology) Jessica Saas, Ingram as Consulting Physician (Orthopedic Surgery) Jessica Bison Charlestine Massed, NP as Nurse Practitioner (Hematology and Oncology) Jessica Ingram:  I connected with Jessica Ingram on 12/27/19 at  3:00 PM EST by telephone visit and verified that I am speaking with the correct person using two identifiers.   I discussed the limitations, risks, security and privacy concerns of performing an evaluation and management service by telemedicine and the availability of in-person appointments. I also discussed with the patient that there may be a patient responsible charge related to this service. The patient expressed understanding and agreed to proceed.   Jessica persons participating in the visit and their role in the encounter: none  Patient's location: home  Provider's location: [place]: Estrogen receptor positive breast cancer  CURRENT TREATMENT: Anastrozole   INTERVAL HISTORY: Jessica Ingram was contacted today for follow up of her estrogen receptor positive breast cancer.  She is doing "great".  She is tolerating anastrozole currently with no side effects of concern.  She had a bone density on 12/20/2019 which showed a T score of -0.3, which is benign.  She will be due for repeat mammography in August 2021.  Note however that her most recent mammogram density was read  as C.  REVIEW OF SYSTEMS: Jessica Ingram is a walking about 3 miles daily.  She also has a stationary bike.  She would like to go to the gym and she has received both her COVID-19 vaccine shots so she is hoping that can be possible at some time in the near future.  A detailed review of systems today was otherwise noncontributory   BREAST CANCER HISTORY: From the original intake note:  "Jessica Ingram" had bilateral screening mammography with tomography at [facility] 01/01/2017. The breast density was category D. In the right breast superiorly there was a 1.1 cm irregular mass. In the left breast there was an area of possible architectural distortion.  The patient was recalled 01/07/2017 or bilateral diagnostic mammography and bilateral ultrasonography. In the right breast superiorly there was a 1.5 cm irregular mass which was located by sonography in the upper inner quadrant area this measured 1.1 cm.  In the left breast the area of architectural distortion was again noted. By ultrasound this was an irregular mass with indistinct margins in the upper inner quadrant of the left breast.  Biopsy of both the areas in question was performed 01/11/2017. 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 The left breast biopsy showed only fibrocystic changes but this was felt to be discordant by radiology  The patient's subsequent history is as detailed below   PAST MEDICAL HISTORY: Past Medical History:  Diagnosis Date  . Arthritis   . Genetic testing 02/19/2017   [name] on 02/02/2017. Her results were negative for mutations in all 46 genes analyzed by [facility]'s 46-gene Common Hereditary Cancers Panel. Genes analyzed  include: APC, ATM, AXIN2, BARD1, BMPR1A, BRCA1, BRCA2, BRIP1, CDH1, CDKN2A, CHEK2, CTNNA1, DICER1, EPCAM, GREM1, HOXB13, KIT, MEN1, MLH1, MSH2, MSH3, MSH6, MUTYH, NBN,  . Hepatitis    at age 39  . Personal history of radiation therapy     PAST SURGICAL HISTORY: Past Surgical History:  Procedure Laterality Date  . ANTERIOR CRUCIATE LIGAMENT REPAIR Left   . AXILLARY SENTINEL NODE BIOPSY Left 03/29/2017   Procedure: LEFT AXILLARY SENTINEL LYMPH NODE BIOPSY, INJECT BLUE DYE LEFT BREAST;  Surgeon: Jessica Skates, Ingram;  Location: [place];  Service: General;  Laterality: Left;  . BREAST LUMPECTOMY Bilateral 03/05/2017  . BREAST LUMPECTOMY WITH RADIOACTIVE SEED AND SENTINEL LYMPH NODE BIOPSY Bilateral 03/05/2017   Procedure: BILATERAL BREAST LUMPECTOMY WITH RADIOACTIVE SEED AND RIGHT AXILLARY SENTINEL LYMPH NODE BIOPSY;  Surgeon: Jessica Skates, Ingram;  Location: [place];  Service: General;  Laterality: Bilateral;  . FINE NEEDLE ASPIRATION Right 03/29/2017   Procedure: ASPIRATION OF SEROMA RIGHT AXILLA;  Surgeon: Jessica Skates, Ingram;  Location: [place];  Service: General;  Laterality: Right;    FAMILY HISTORY Family History  Problem Relation Age of Onset  . Lung cancer Mother 75       d.93 history of smoking  . Prostate cancer Father 47       d.83 prostate cancer metastasized to bone  . Colon cancer Brother 26       d.53  . Breast cancer Maternal Aunt 73       d.85s  . Colon cancer Maternal Aunt 83  . Bone cancer Paternal Aunt        d.85  . Prostate cancer Paternal Uncle 65       d.72s metastasized to bladder  . Kidney cancer Maternal Grandfather 58       d.53 possible colon cancer  . Lung cancer Paternal Uncle 17       d.60  . Brain cancer Cousin 87       d.11 paternal first-cousin. Son of uncle with prostate cancer.  . Prostate cancer Paternal Grandfather        d.89  The patient's father was diagnosed with prostate cancer at the age  of 8. He died from unrelated causes at age 36. The patient's mother was diagnosed with lung cancer at age 44. She was a smoker. She died from unrelated causes at age 56. The patient has one brother, 5 sisters. The brother was diagnosed with colon cancer at age 54 and died at age 64. There is also a maternal aunt diagnosed with both breast and colon cancers in her 81s. There is no history of ovarian cancer in the family   GYNECOLOGIC HISTORY:  No LMP recorded. Patient is postmenopausal. Menarche age 53, first live birth age 37, which increases the risk of breast cancer developing. The patient is GX P2. She went through the change of life age 53 and was on hormone replacement until age 47. She also took oral contraceptives remotely for about 12 years with no complications   SOCIAL HISTORY:  Jessica Ingram is widowed; her husband died from complications of depression in September 2017. She works in Pharmacologist for a Education officer, museum, doing their IT, website, and Jessica outreach. 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  Her daughter Jolyne Loa lives in [place] where  she works as an Therapist, music, and daughter [place] lives in [place] where she works for the baseball program at [employer].  Patient will be a grandmother to a baby girl in May 2020. She is not a Ambulance person.    ADVANCED DIRECTIVES:  in place; the patient has named her sister Bevely Palmer as healthcare power of attorney. Rod Holler may be reached at [phone]    HEALTH MAINTENANCE: Social History   Tobacco Use  . Smoking status: Never Smoker  . Smokeless tobacco: Never Used  Substance Use Topics  . Alcohol use: Yes    Comment: 7  . Drug use: No     Colonoscopy: January 2017/man  PAP: 2014  Bone density: 08/2017, -0.8   No Known Allergies  Current Outpatient Medications  Medication Sig Dispense Refill  . anastrozole (ARIMIDEX) 1 MG tablet TAKE 1 TABLET(1 MG) BY MOUTH DAILY 90 tablet 4  . ondansetron (ZOFRAN)  4 MG tablet Take 1 tablet (4 mg total) by mouth every 8 (eight) hours as needed for nausea or vomiting. (Patient not taking: Reported on 12/08/2018) 8 tablet 0   No current facility-administered medications for this visit.    OBJECTIVE: Middle-aged white woman   There were no vitals filed for this visit.   There is no height or weight on file to calculate BMI.    Televisit  LAB RESULTS:  CMP     Component Value Date/Time   NA 139 12/05/2018 1027   NA 137 09/21/2017 1054   K 4.7 12/05/2018 1027   K 4.4 09/21/2017 1054   CL 105 12/05/2018 1027   CO2 24 12/05/2018 1027   CO2 25 09/21/2017 1054   GLUCOSE 97 12/05/2018 1027   GLUCOSE 101 09/21/2017 1054   BUN 13 12/05/2018 1027   BUN 12.6 09/21/2017 1054   CREATININE 0.92 12/05/2018 1027   CREATININE 0.8 09/21/2017 1054   CALCIUM 9.7 12/05/2018 1027   CALCIUM 9.7 09/21/2017 1054   PROT 7.5 12/05/2018 1027   PROT 7.8 09/21/2017 1054   ALBUMIN 4.7 12/05/2018 1027   ALBUMIN 4.1 09/21/2017 1054   AST 20 12/05/2018 1027   AST 22 09/21/2017 1054   ALT 16 12/05/2018 1027   ALT 21 09/21/2017 1054   ALKPHOS 56 12/05/2018 1027   ALKPHOS 81 09/21/2017 1054   BILITOT 0.8 12/05/2018 1027   BILITOT 0.45 09/21/2017 1054   GFRNONAA >60 12/05/2018 1027   GFRAA >60 12/05/2018 1027    No results found for: TOTALPROTELP, ALBUMINELP, A1GS, A2GS, BETS, BETA2SER, GAMS, MSPIKE, SPEI  No results found for: Nils Pyle, [facility]  Lab Results  Component Value Date   WBC 3.6 (L) 12/05/2018   NEUTROABS 2.1 12/05/2018   HGB 13.9 12/05/2018   HCT 41.5 12/05/2018   MCV 97.0 12/05/2018   PLT 153 12/05/2018      Chemistry      Component Value Date/Time   NA 139 12/05/2018 1027   NA 137 09/21/2017 1054   K 4.7 12/05/2018 1027   K 4.4 09/21/2017 1054   CL 105 12/05/2018 1027   CO2 24 12/05/2018 1027   CO2 25 09/21/2017 1054   BUN 13 12/05/2018 1027   BUN 12.6 09/21/2017 1054   CREATININE 0.92 12/05/2018 1027    CREATININE 0.8 09/21/2017 1054      Component Value Date/Time   CALCIUM 9.7 12/05/2018 1027   CALCIUM 9.7 09/21/2017 1054   ALKPHOS 56 12/05/2018 1027   ALKPHOS 81 09/21/2017 1054   AST 20  12/05/2018 1027   AST 22 09/21/2017 1054   ALT 16 12/05/2018 1027   ALT 21 09/21/2017 1054   BILITOT 0.8 12/05/2018 1027   BILITOT 0.45 09/21/2017 1054       No results found for: LABCA2  No components found for: [id number]  No results for input(s): INR in the last 168 hours.  Urinalysis No results found for: COLORURINE, APPEARANCEUR, LABSPEC, PHURINE, GLUCOSEU, HGBUR, BILIRUBINUR, KETONESUR, PROTEINUR, UROBILINOGEN, NITRITE, LEUKOCYTESUR   STUDIES: DG BONE DENSITY (DXA)  Result Date: 12/20/2019 EXAM: DUAL X-RAY ABSORPTIOMETRY (DXA) FOR BONE MINERAL DENSITY IMPRESSION: Referring Physician:  Chauncey Cruel Your patient completed a BMD test using Lunar IDXA DXA system ( analysis version: 16 ) manufactured by [employer]. Technologist: KT PATIENT: Name: Lilyrose, Tanney Patient ID: [id number] Birth Date: 1951-01-27 Height: 65.5 in. Sex: Female Measured: 12/20/2019 Weight: 139.4 lbs. Indications: Anastrazole, Breast Cancer History, Caucasian, Estrogen Deficient, History of Fracture (Adult) (V15.51), Postmenopausal Fractures: Rt forearm Treatments: Hormone Therapy For Cancer ASSESSMENT: The BMD measured at Femur Total Left is 0.974 g/cm2 with a T-score of -0.3. This patient is considered normal according to [name]) criteria. There has been no statistically significant change in BMD of Total Mean hip since prior exam dated 09/22/2017. The scan quality is good. L-3, L-4 were excluded due to degenerative changes. Site Region Measured Date Measured Age YA BMD Significant CHANGE T-score AP Spine L1-L2 12/20/2019 66.9 [age] 1.249 g/cm2 * AP Spine  L1-L2      09/22/2017    64.7         -0.8    1.073 g/cm2 DualFemur Total Left 14/20/2019    66.9         -0.3    0.974 g/cm2 DualFemur Total  Left 09/22/2017    64.7         -0.1    0.994 g/cm2 DualFemur Total Mean 12/20/2019    66.9         -0.1    1.000 g/cm2 DualFemur Total Mean 09/22/2017    64.7         0.0     1.012 g/cm2 World Health Organization [facility]) criteria for post-menopausal, Caucasian Women: Normal       T-score at or above -1 SD Osteopenia   T-score between -1 and -2.5 SD Osteoporosis T-score at or below -2.5 SD RECOMMENDATION: 1. All patients should optimize calcium and vitamin D intake. 2. Consider FDA approved medical therapies in postmenopausal women and men aged 28 years and older, based on the following: a. A hip or vertebral (clinical or morphometric) fracture b. T- score < or = -2.5 at the femoral neck or spine after appropriate evaluation to exclude secondary causes c. Low bone mass (T-score between -1.0 and -2.5 at the femoral neck or spine) and a 10 year probability of a hip fracture > or = 3% or a 10 year probability of a major osteoporosis-related fracture > or = 20% based on the [place]-adapted WHO algorithm d. Clinician judgment and/or patient preferences may indicate treatment for people with 10-year fracture probabilities above or below these levels FOLLOW-UP: Patients with diagnosis of osteoporosis or at high risk for fracture should have regular bone mineral density tests. For patients eligible for Medicare, routine testing is allowed once every 2 years. The testing frequency can be increased to one year for patients who have rapidly progressing disease, those who are receiving or discontinuing medical therapy to restore bone mass, or have additional risk factors. I have  reviewed this report and agree with the above findings. [facility] Radiology Electronically Signed   By: Lowella Grip III M.D.   On: 12/20/2019 17:00     ELIGIBLE FOR AVAILABLE RESEARCH PROTOCOL: no  ASSESSMENT: 70 y.o.  [place] woman status post right breast upper inner quadrant biopsy 01/11/2017 for a clinical  T1c N0, stage IA invasive ductal  carcinoma, grade 1, estrogen and progesterone receptor positive, HER-2 nonamplified, with an MIB-1 of 5%.   (1) Left lumpectomy and sentinel lymph node sampling 03/05/2017 showed a pT1c pN0 invasive ductal carcinoma, grade 1, with close but negative margins, estrogen receptor strongly positive, progesterone receptor negative, with an MIB-1 of 5% and no HER-2 amplification.  (a) left axillary sentinel lymph node biopsy 03/29/2017 showed no lymph node involvement  (2) right lumpectomy 03/05/2017 showed a pT1c pN0 invasive ductal carcinoma, grade 1, with negative margins  (3) Oncotype DX scores of 19-21 predicts a 10 year risk of recurrence outside the breast of 12-13% if the patient's only systemic therapy is tamoxifen for 5 years. It also predicts no benefit from chemotherapy.  (4) adjuvant radiation completed 05/26/2017  (5) started anastrozole 07/15/2017  (a) bone density at the [facility] 09/22/2017 showed a T score of -0.8  (b) bone density 12/20/2019  (6) genetics testing 02/15/2017 through [facility]'s Common Hereditary Cancers Panel showed no deleterious mutations in APC, ATM, AXIN2, BARD1, BMPR1A, BRCA1, BRCA2, BRIP1, CDH1, CDKN2A, CHEK2, CTNNA1, DICER1, EPCAM, GREM1, HOXB13, KIT, MEN1, MLH1, MSH2, MSH3, MSH6, MUTYH, NBN, NF1, NTHL1, PALB2, PDGFRA, PMS2, POLD1, POLE, PTEN, RAD50, RAD51C, RAD51D, SDHA, SDHB, SDHC, SDHD, SMAD4, SMARCA4, STK11, TP53, TSC1, TSC2, and VHL.   (a) Variants of uncertain significance (VUSs) were identified in three genes.    DICER1 c.2027G>C (p.Arg676Pro)   TSC2 c.4325A>T (p.Glu1442Val)   CTNNA1 c.410G>A (p.Arg137Gln)  (7) breast density category C  (a) yearly breast MRI scheduled for 03/15/2019   PLAN: Jessica Ingram is now nearly 3 years out from definitive surgery for her breast cancer with no evidence of disease recurrence.  This is very favorable.  She is tolerating anastrozole well and the plan is to continue that for total of 5 years.  We again discussed  intensified screening given her significant breast density, even though the density has decreased some doubtless because of the anastrozole.  I am putting her in for a breast MRI this month and then she will have her mammography towards the end of August.  I will see her in September  She knows to call for any Jessica issue that may develop before then.   Grabiel Schmutz, Jessica Dad, Ingram  12/27/19 3:29 PM Medical Oncology and Hematology [facility] [place], [place] [address] Tel. [phone]    Fax. [phone]   I, Wilburn Mylar, am acting as scribe for Dr. Virgie Dad. Justeen Hehr.  I, Lurline Del Ingram, have reviewed the above documentation for accuracy and completeness, and I agree with the above.   *Total Encounter Time as defined by the Centers for Medicare and Medicaid Services includes, in addition to the face-to-face time of a patient visit (documented in the note above) non-face-to-face time: obtaining and reviewing outside history, ordering and reviewing medications, tests or procedures, care coordination (communications with Jessica health care professionals or caregivers) and documentation in the medical record.

## 2019-12-27 ENCOUNTER — Inpatient Hospital Stay: Payer: Medicare Other | Attending: Oncology | Admitting: Oncology

## 2019-12-27 DIAGNOSIS — Z170 Estrogen receptor positive status [ER+]: Secondary | ICD-10-CM

## 2019-12-27 DIAGNOSIS — C50212 Malignant neoplasm of upper-inner quadrant of left female breast: Secondary | ICD-10-CM | POA: Diagnosis not present

## 2019-12-27 DIAGNOSIS — C50211 Malignant neoplasm of upper-inner quadrant of right female breast: Secondary | ICD-10-CM

## 2019-12-28 ENCOUNTER — Telehealth: Payer: Self-pay | Admitting: Oncology

## 2019-12-28 NOTE — Telephone Encounter (Signed)
I talk with patient regarding schedule  

## 2020-01-31 DIAGNOSIS — L570 Actinic keratosis: Secondary | ICD-10-CM | POA: Diagnosis not present

## 2020-01-31 DIAGNOSIS — L820 Inflamed seborrheic keratosis: Secondary | ICD-10-CM | POA: Diagnosis not present

## 2020-01-31 DIAGNOSIS — L814 Other melanin hyperpigmentation: Secondary | ICD-10-CM | POA: Diagnosis not present

## 2020-01-31 DIAGNOSIS — D1801 Hemangioma of skin and subcutaneous tissue: Secondary | ICD-10-CM | POA: Diagnosis not present

## 2020-01-31 DIAGNOSIS — L821 Other seborrheic keratosis: Secondary | ICD-10-CM | POA: Diagnosis not present

## 2020-01-31 DIAGNOSIS — D2372 Other benign neoplasm of skin of left lower limb, including hip: Secondary | ICD-10-CM | POA: Diagnosis not present

## 2020-02-01 DIAGNOSIS — Z800 Family history of malignant neoplasm of digestive organs: Secondary | ICD-10-CM | POA: Diagnosis not present

## 2020-02-01 DIAGNOSIS — Z1211 Encounter for screening for malignant neoplasm of colon: Secondary | ICD-10-CM | POA: Diagnosis not present

## 2020-02-01 DIAGNOSIS — K5730 Diverticulosis of large intestine without perforation or abscess without bleeding: Secondary | ICD-10-CM | POA: Diagnosis not present

## 2020-02-08 ENCOUNTER — Other Ambulatory Visit: Payer: Medicare Other

## 2020-02-13 ENCOUNTER — Ambulatory Visit
Admission: RE | Admit: 2020-02-13 | Discharge: 2020-02-13 | Disposition: A | Discharge status: Home / Self Care | Payer: Medicare Other | Source: Ambulatory Visit | Attending: Oncology | Admitting: Oncology

## 2020-02-13 ENCOUNTER — Other Ambulatory Visit: Payer: Self-pay

## 2020-02-13 DIAGNOSIS — C50212 Malignant neoplasm of upper-inner quadrant of left female breast: Secondary | ICD-10-CM

## 2020-02-13 DIAGNOSIS — C50211 Malignant neoplasm of upper-inner quadrant of right female breast: Secondary | ICD-10-CM

## 2020-02-13 DIAGNOSIS — Z853 Personal history of malignant neoplasm of breast: Secondary | ICD-10-CM | POA: Diagnosis not present

## 2020-02-13 DIAGNOSIS — Z170 Estrogen receptor positive status [ER+]: Secondary | ICD-10-CM

## 2020-02-13 MED ORDER — GADOBUTROL 1 MMOL/ML IV SOLN
6.0000 mL | Freq: Once | INTRAVENOUS | Status: AC | PRN
  Administered 2020-02-13: 6 mL via INTRAVENOUS

## 2020-05-30 ENCOUNTER — Ambulatory Visit
Admission: RE | Admit: 2020-05-30 | Discharge: 2020-05-30 | Disposition: A | Discharge status: Home / Self Care | Payer: Medicare Other | Source: Ambulatory Visit | Attending: Oncology | Admitting: Oncology

## 2020-05-30 ENCOUNTER — Other Ambulatory Visit: Payer: Self-pay

## 2020-05-30 DIAGNOSIS — R922 Inconclusive mammogram: Secondary | ICD-10-CM | POA: Diagnosis not present

## 2020-05-30 DIAGNOSIS — C50212 Malignant neoplasm of upper-inner quadrant of left female breast: Secondary | ICD-10-CM

## 2020-05-30 DIAGNOSIS — C50211 Malignant neoplasm of upper-inner quadrant of right female breast: Secondary | ICD-10-CM

## 2020-05-30 DIAGNOSIS — Z170 Estrogen receptor positive status [ER+]: Secondary | ICD-10-CM

## 2020-07-08 NOTE — Progress Notes (Signed)
Jessica Ingram  [phone] [phone]     ID: Jessica Ingram DOB: 03/11/1951  MR#: [id number]  PFX#:[id number]  Patient Care Team: Marda Stalker, PA-C as PCP - General (Family Medicine) Fanny Skates, MD as Consulting Physician (General Surgery) Tiffiny Worthy, Virgie Dad, MD as Consulting Physician (Oncology) Kyung Rudd, MD as Consulting Physician (Radiation Oncology) Juanita Craver, MD as Consulting Physician (Gastroenterology) Sydnee Levans, MD as Consulting Physician (Dermatology) Azucena Fallen, MD as Consulting Physician (Obstetrics and Gynecology) Elsie Saas, MD as Consulting Physician (Orthopedic Surgery) Delice Bison, Charlestine Massed, NP as Nurse Practitioner (Hematology and Oncology) OTHER MD:   CHIEF COMPLAINT: Estrogen receptor positive breast cancer  CURRENT TREATMENT: Anastrozole   INTERVAL HISTORY: Jessica Ingram returns  today for follow up of her estrogen receptor positive breast cancer.  She continues on anastrozole, which she tolerates well.  Hot flashes and vaginal dryness are not an issue for her.  Her most recent bone density screening on 12/20/2019 showed a T score of -0.3, which is considered normal.  Since her last visit, she underwent breast MRI on 02/13/2020 showing: breast composition D; no evidence of malignancy.  She also underwent bilateral diagnostic mammography with tomography at [facility] on 05/30/2020 showing: breast density category D; no evidence of malignancy in either breast.   REVIEW OF SYSTEMS: Jessica Ingram has an excellent exercise program and walks several miles most days.  She has not yet gone back to the gym.  She did receive the [employer] vaccine x2.  She had a strong reaction to the second dose.  She is particularly excited because she is expecting a second grandchild March 2022.  Aside from these issues a detailed review of systems today was stable.   BREAST CANCER HISTORY: From the original intake  note:  "Jessica Ingram" had bilateral screening mammography with tomography at [facility] 01/01/2017. The breast density was category D. In the right breast superiorly there was a 1.1 cm irregular mass. In the left breast there was an area of possible architectural distortion.  The patient was recalled 01/07/2017 or bilateral diagnostic mammography and bilateral ultrasonography. In the right breast superiorly there was a 1.5 cm irregular mass which was located by sonography in the upper inner quadrant area this measured 1.1 cm.  In the left breast the area of architectural distortion was again noted. By ultrasound this was an irregular mass with indistinct margins in the upper inner quadrant of the left breast.  Biopsy of both the areas in question was performed 01/11/2017. 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 The left breast biopsy showed only fibrocystic changes but this was felt to be discordant by radiology  The patient's subsequent history is as detailed below   PAST MEDICAL HISTORY: Past Medical History:  Diagnosis Date  . Arthritis   . Genetic testing 02/19/2017   Ms. Denison underwent genetic counseling and testing for hereditary cancer syndromes on 02/02/2017. Her results were negative for mutations in all 46 genes analyzed by [facility]'s 46-gene Common Hereditary Cancers Panel. Genes analyzed include: APC, ATM, AXIN2, BARD1, BMPR1A, BRCA1, BRCA2, BRIP1, CDH1, CDKN2A, CHEK2, CTNNA1, DICER1, EPCAM, GREM1, HOXB13, KIT, MEN1, MLH1, MSH2, MSH3, MSH6, MUTYH, NBN,  . Hepatitis    at age 69  . Personal history of radiation therapy     PAST SURGICAL  HISTORY: Past Surgical History:  Procedure Laterality Date  . ANTERIOR CRUCIATE LIGAMENT REPAIR Left   . AXILLARY SENTINEL  NODE BIOPSY Left 03/29/2017   Procedure: LEFT AXILLARY SENTINEL LYMPH NODE BIOPSY, INJECT BLUE DYE LEFT BREAST;  Surgeon: Fanny Skates, MD;  Location: [facility];  Service: General;  Laterality: Left;  . BREAST BIOPSY Left 02/05/2017  . BREAST BIOPSY Right 2018  . BREAST LUMPECTOMY Bilateral 03/05/2017  . BREAST LUMPECTOMY WITH RADIOACTIVE SEED AND SENTINEL LYMPH NODE BIOPSY Bilateral 03/05/2017   Procedure: BILATERAL BREAST LUMPECTOMY WITH RADIOACTIVE SEED AND RIGHT AXILLARY SENTINEL LYMPH NODE BIOPSY;  Surgeon: Fanny Skates, MD;  Location: [facility];  Service: General;  Laterality: Bilateral;  . FINE NEEDLE ASPIRATION Right 03/29/2017   Procedure: ASPIRATION OF SEROMA RIGHT AXILLA;  Surgeon: Fanny Skates, MD;  Location: [facility];  Service: General;  Laterality: Right;    FAMILY HISTORY Family History  Problem Relation Age of Onset  . Lung cancer Mother 32       d.93 history of smoking  . Prostate cancer Father 37       d.83 prostate cancer metastasized to bone  . Colon cancer Brother 3       d.53  . Breast cancer Maternal Aunt 73       d.85s  . Colon cancer Maternal Aunt 83  . Bone cancer Paternal Aunt        d.85  . Prostate cancer Paternal Uncle 35       d.72s metastasized to bladder  . Kidney cancer Maternal Grandfather 36       d.53 possible colon cancer  . Lung cancer Paternal Uncle 54       d.60  . Brain cancer Cousin 63       d.11 paternal first-cousin. Son of uncle with prostate cancer.  . Prostate cancer Paternal Grandfather        d.89  The patient's father was diagnosed with prostate cancer at the age of 37. He died from unrelated causes at age 58. The patient's mother was diagnosed with lung cancer at age 48. She was a smoker. She died from unrelated causes at age 79. The patient has one brother, 5 sisters. The brother was diagnosed with colon cancer at age 43 and died at age 25. There is also a maternal aunt  diagnosed with both breast and colon cancers in her 55s. There is no history of ovarian cancer in the family   GYNECOLOGIC HISTORY:  No LMP recorded. Patient is postmenopausal. Menarche age 9, first live birth age 39, which increases the risk of breast cancer developing. The patient is GX P2. She went through the change of life age 29 and was on hormone replacement until age 16. She also took oral contraceptives remotely for about 12 years with no complications   SOCIAL HISTORY:  Jessica Ingram is widowed; her husband died from complications of depression in September 2017. 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  Her daughter Jolyne Loa lives in [place] where she works as an Therapist, music, and daughter [place] lives in [place] where she works for the baseball program at [employer].  Patient became grandmother to a baby girl in May 2020 and is expecting a second grandchild March 2021. She is not a Ambulance person.    ADVANCED DIRECTIVES:  in place; the patient has named  her sister Bevely Palmer as healthcare power of attorney. Rod Holler may be reached at [phone]    HEALTH MAINTENANCE: Social History   Tobacco Use  . Smoking status: Never Smoker  . Smokeless tobacco: Never Used  Vaping Use  . Vaping Use: Never used  Substance Use Topics  . Alcohol use: Yes    Comment: 7  . Drug use: No     Colonoscopy: January 2017/man  PAP: 2014  Bone density: 08/2017, -0.8   No Known Allergies  Current Outpatient Medications  Medication Sig Dispense Refill  . anastrozole (ARIMIDEX) 1 MG tablet TAKE 1 TABLET(1 MG) BY MOUTH DAILY 90 tablet 4   No current facility-administered medications for this visit.    OBJECTIVE: Middle-aged white woman who appears well  Vitals:   07/09/20 1415  BP: 137/81  Pulse: 72  Resp: 18  Temp: 97.6 F (36.4 C)  SpO2: 100%     Body mass index is 21.91  kg/m.    Sclerae unicteric, EOMs intact Wearing a mask No cervical or supraclavicular adenopathy Lungs no rales or rhonchi Heart regular rate and rhythm Abd soft, nontender, positive bowel sounds MSK no focal spinal tenderness, no upper extremity lymphedema Neuro: nonfocal, well oriented, appropriate affect Breasts: Status post bilateral lumpectomies with bilateral radiation.  There is no evidence of local recurrence.  Both axillae are benign.   LAB RESULTS:  CMP     Component Value Date/Time   NA 136 07/09/2020 1408   NA 137 09/21/2017 1054   K 4.1 07/09/2020 1408   K 4.4 09/21/2017 1054   CL 103 07/09/2020 1408   CO2 28 07/09/2020 1408   CO2 25 09/21/2017 1054   GLUCOSE 148 (H) 07/09/2020 1408   GLUCOSE 101 09/21/2017 1054   BUN 10 07/09/2020 1408   BUN 12.6 09/21/2017 1054   CREATININE 0.91 07/09/2020 1408   CREATININE 0.92 12/05/2018 1027   CREATININE 0.8 09/21/2017 1054   CALCIUM 9.3 07/09/2020 1408   CALCIUM 9.7 09/21/2017 1054   PROT 7.1 07/09/2020 1408   PROT 7.8 09/21/2017 1054   ALBUMIN 4.2 07/09/2020 1408   ALBUMIN 4.1 09/21/2017 1054   AST 18 07/09/2020 1408   AST 20 12/05/2018 1027   AST 22 09/21/2017 1054   ALT 11 07/09/2020 1408   ALT 16 12/05/2018 1027   ALT 21 09/21/2017 1054   ALKPHOS 55 07/09/2020 1408   ALKPHOS 81 09/21/2017 1054   BILITOT 0.3 07/09/2020 1408   BILITOT 0.8 12/05/2018 1027   BILITOT 0.45 09/21/2017 1054   GFRNONAA >60 07/09/2020 1408   GFRNONAA >60 12/05/2018 1027   GFRAA >60 07/09/2020 1408   GFRAA >60 12/05/2018 1027    No results found for: TOTALPROTELP, ALBUMINELP, A1GS, A2GS, BETS, BETA2SER, GAMS, MSPIKE, SPEI  No results found for: KPAFRELGTCHN, LAMBDASER, [facility]  Lab Results  Component Value Date   WBC 4.6 07/09/2020   NEUTROABS 3.1 07/09/2020   HGB 13.0 07/09/2020   HCT 38.4 07/09/2020   MCV 95.3 07/09/2020   PLT 169 07/09/2020      Chemistry      Component Value Date/Time   NA 136 07/09/2020  1408   NA 137 09/21/2017 1054   K 4.1 07/09/2020 1408   K 4.4 09/21/2017 1054   CL 103 07/09/2020 1408   CO2 28 07/09/2020 1408   CO2 25 09/21/2017 1054   BUN 10 07/09/2020 1408   BUN 12.6 09/21/2017 1054   CREATININE 0.91 07/09/2020 1408   CREATININE 0.92 12/05/2018 1027  CREATININE 0.8 09/21/2017 1054      Component Value Date/Time   CALCIUM 9.3 07/09/2020 1408   CALCIUM 9.7 09/21/2017 1054   ALKPHOS 55 07/09/2020 1408   ALKPHOS 81 09/21/2017 1054   AST 18 07/09/2020 1408   AST 20 12/05/2018 1027   AST 22 09/21/2017 1054   ALT 11 07/09/2020 1408   ALT 16 12/05/2018 1027   ALT 21 09/21/2017 1054   BILITOT 0.3 07/09/2020 1408   BILITOT 0.8 12/05/2018 1027   BILITOT 0.45 09/21/2017 1054       No results found for: LABCA2  No components found for: OEUMPN361  No results for input(s): INR in the last 168 hours.  Urinalysis No results found for: COLORURINE, APPEARANCEUR, LABSPEC, PHURINE, GLUCOSEU, HGBUR, BILIRUBINUR, KETONESUR, PROTEINUR, UROBILINOGEN, NITRITE, LEUKOCYTESUR   STUDIES: No results found.   ELIGIBLE FOR AVAILABLE RESEARCH PROTOCOL: no  ASSESSMENT: 69 y.o. [place] woman status post right breast upper inner quadrant biopsy 01/11/2017 for a clinical  T1c N0, stage IA invasive ductal carcinoma, grade 1, estrogen and progesterone receptor positive, HER-2 nonamplified, with an MIB-1 of 5%.   (1) Left lumpectomy and sentinel lymph node sampling 03/05/2017 showed a pT1c pN0 invasive ductal carcinoma, grade 1, with close but negative margins, estrogen receptor strongly positive, progesterone receptor negative, with an MIB-1 of 5% and no HER-2 amplification.  (a) left axillary sentinel lymph node biopsy 03/29/2017 showed no lymph node involvement  (2) right lumpectomy 03/05/2017 showed a pT1c pN0 invasive ductal carcinoma, grade 1, with negative margins  (3) Oncotype DX scores of 19-21 predicts a 10 year risk of recurrence outside the breast of 12-13% if the  patient's only systemic therapy is tamoxifen for 5 years. It also predicts no benefit from chemotherapy.  (4) adjuvant radiation completed 05/26/2017  (5) started anastrozole 07/15/2017  (a) [facility] 09/22/2017 showed a T score of -0.8  (b) bone density 12/20/2019 shows a T score of -0.3, normal  (6) genetics testing 02/15/2017 through [facility]'s Common Hereditary Cancers Panel showed no deleterious mutations in APC, ATM, AXIN2, BARD1, BMPR1A, BRCA1, BRCA2, BRIP1, CDH1, CDKN2A, CHEK2, CTNNA1, DICER1, EPCAM, GREM1, HOXB13, KIT, MEN1, MLH1, MSH2, MSH3, MSH6, MUTYH, NBN, NF1, NTHL1, PALB2, PDGFRA, PMS2, POLD1, POLE, PTEN, RAD50, RAD51C, RAD51D, SDHA, SDHB, SDHC, SDHD, SMAD4, SMARCA4, STK11, TP53, TSC1, TSC2, and VHL.   (a) Variants of uncertain significance (VUSs) were identified in three genes.    DICER1 c.2027G>C (p.Arg676Pro)   TSC2 c.4325A>T (p.Glu1442Val)   CTNNA1 c.410G>A (p.Arg137Gln)  (7) breast density category D  (a) yearly breast MRI alternating with yearly mammography   PLAN: Jessica Ingram is now a little over 3 years out from definitive surgery for breast cancer with no evidence of disease recurrence.  This is very febrile.  She is tolerating tamoxifen well and the plan is to continue that a minimum of 5 years.  I commended her excellent exercise program and congratulated her on her upcoming second grandchild.  Her breast continues to be exceedingly dense which means mammography is almost useless.  It is not completely useless because it does show calcifications and if we see clusters or linear or other suspicious calcifications that would be of significance.  The MRI of course is much better at making up masses and she is being scheduled for MRI in February so that the MRI and mammograms which are obtained in August are 6 months apart.  Total encounter time 25 minutes.*  Yocelin Vanlue, Virgie Dad, MD  07/09/20 3:15 PM Medical  Oncology and Hematology [facility] [place], [place] [address] Tel. [phone]    Fax. [phone]   I, Wilburn Mylar, am acting as scribe for Dr. Virgie Dad. Elise Knobloch.  I, Lurline Del MD, have reviewed the above documentation for accuracy and completeness, and I agree with the above.   *Total Encounter Time as defined by the Centers for Medicare and Medicaid Services includes, in addition to the face-to-face time of a patient visit (documented in the note above) non-face-to-face time: obtaining and reviewing outside history, ordering and reviewing medications, tests or procedures, care coordination (communications with other health care professionals or caregivers) and documentation in the medical record.

## 2020-07-09 ENCOUNTER — Inpatient Hospital Stay: Payer: Medicare Other | Attending: Oncology | Admitting: Oncology

## 2020-07-09 ENCOUNTER — Other Ambulatory Visit: Payer: Self-pay

## 2020-07-09 ENCOUNTER — Inpatient Hospital Stay: Payer: Medicare Other

## 2020-07-09 ENCOUNTER — Telehealth: Payer: Self-pay | Admitting: Oncology

## 2020-07-09 VITALS — BP 137/81 | HR 72 | Temp 97.6°F | Resp 18 | Ht 66.5 in | Wt 137.8 lb

## 2020-07-09 DIAGNOSIS — Z79811 Long term (current) use of aromatase inhibitors: Secondary | ICD-10-CM | POA: Diagnosis not present

## 2020-07-09 DIAGNOSIS — C50212 Malignant neoplasm of upper-inner quadrant of left female breast: Secondary | ICD-10-CM

## 2020-07-09 DIAGNOSIS — Z170 Estrogen receptor positive status [ER+]: Secondary | ICD-10-CM

## 2020-07-09 DIAGNOSIS — C50211 Malignant neoplasm of upper-inner quadrant of right female breast: Secondary | ICD-10-CM

## 2020-07-09 DIAGNOSIS — Z923 Personal history of irradiation: Secondary | ICD-10-CM | POA: Diagnosis not present

## 2020-07-09 DIAGNOSIS — M1990 Unspecified osteoarthritis, unspecified site: Secondary | ICD-10-CM | POA: Insufficient documentation

## 2020-07-09 LAB — CBC WITH DIFFERENTIAL/PLATELET
Abs Immature Granulocytes: 0.01 10*3/uL (ref 0.00–0.07)
Basophils Absolute: 0 10*3/uL (ref 0.0–0.1)
Basophils Relative: 0 %
Eosinophils Absolute: 0 10*3/uL (ref 0.0–0.5)
Eosinophils Relative: 1 %
HCT: 38.4 % (ref 36.0–46.0)
Hemoglobin: 13 g/dL (ref 12.0–15.0)
Immature Granulocytes: 0 %
Lymphocytes Relative: 24 %
Lymphs Abs: 1.1 10*3/uL (ref 0.7–4.0)
MCH: 32.3 pg (ref 26.0–34.0)
MCHC: 33.9 g/dL (ref 30.0–36.0)
MCV: 95.3 fL (ref 80.0–100.0)
Monocytes Absolute: 0.4 10*3/uL (ref 0.1–1.0)
Monocytes Relative: 8 %
Neutro Abs: 3.1 10*3/uL (ref 1.7–7.7)
Neutrophils Relative %: 67 %
Platelets: 169 10*3/uL (ref 150–400)
RBC: 4.03 MIL/uL (ref 3.87–5.11)
RDW: 11.7 % (ref 11.5–15.5)
WBC: 4.6 10*3/uL (ref 4.0–10.5)
nRBC: 0 % (ref 0.0–0.2)

## 2020-07-09 LAB — COMPREHENSIVE METABOLIC PANEL
ALT: 11 U/L (ref 0–44)
AST: 18 U/L (ref 15–41)
Albumin: 4.2 g/dL (ref 3.5–5.0)
Alkaline Phosphatase: 55 U/L (ref 38–126)
Anion gap: 5 (ref 5–15)
BUN: 10 mg/dL (ref 8–23)
CO2: 28 mmol/L (ref 22–32)
Calcium: 9.3 mg/dL (ref 8.9–10.3)
Chloride: 103 mmol/L (ref 98–111)
Creatinine, Ser: 0.91 mg/dL (ref 0.44–1.00)
GFR calc Af Amer: >60 mL/min (ref >60)
GFR calc non Af Amer: >60 mL/min (ref >60)
Glucose, Bld: 148 mg/dL — ABNORMAL HIGH (ref 70–99)
Potassium: 4.1 mmol/L (ref 3.5–5.1)
Sodium: 136 mmol/L (ref 135–145)
Total Bilirubin: 0.3 mg/dL (ref 0.3–1.2)
Total Protein: 7.1 g/dL (ref 6.5–8.1)

## 2020-07-09 MED ORDER — ANASTROZOLE 1 MG PO TABS: ORAL_TABLET | ORAL | 4 refills | Status: DC

## 2020-07-09 NOTE — Telephone Encounter (Signed)
Scheduled appts per 9/14 los. Pt confirmed appt date and time.

## 2020-10-04 DIAGNOSIS — Z23 Encounter for immunization: Secondary | ICD-10-CM | POA: Diagnosis not present

## 2020-11-04 ENCOUNTER — Ambulatory Visit
Admission: RE | Admit: 2020-11-04 | Discharge: 2020-11-04 | Disposition: A | Discharge status: Home / Self Care | Payer: Medicare Other | Source: Ambulatory Visit | Attending: Oncology | Admitting: Oncology

## 2020-11-04 ENCOUNTER — Other Ambulatory Visit: Payer: Self-pay

## 2020-11-04 DIAGNOSIS — N6489 Other specified disorders of breast: Secondary | ICD-10-CM | POA: Diagnosis not present

## 2020-11-04 DIAGNOSIS — C50211 Malignant neoplasm of upper-inner quadrant of right female breast: Secondary | ICD-10-CM

## 2020-11-04 DIAGNOSIS — C50212 Malignant neoplasm of upper-inner quadrant of left female breast: Secondary | ICD-10-CM

## 2020-11-04 DIAGNOSIS — Z170 Estrogen receptor positive status [ER+]: Secondary | ICD-10-CM

## 2020-11-04 MED ORDER — GADOBUTROL 1 MMOL/ML IV SOLN
6.0000 mL | Freq: Once | INTRAVENOUS | Status: AC | PRN
  Administered 2020-11-04: 6 mL via INTRAVENOUS

## 2020-11-13 DIAGNOSIS — Z20822 Contact with and (suspected) exposure to covid-19: Secondary | ICD-10-CM | POA: Diagnosis not present

## 2020-11-13 DIAGNOSIS — Z03818 Encounter for observation for suspected exposure to other biological agents ruled out: Secondary | ICD-10-CM | POA: Diagnosis not present

## 2020-12-30 ENCOUNTER — Other Ambulatory Visit: Payer: Self-pay | Admitting: Oncology

## 2021-05-01 DIAGNOSIS — Z23 Encounter for immunization: Secondary | ICD-10-CM | POA: Diagnosis not present

## 2021-07-15 ENCOUNTER — Inpatient Hospital Stay: Payer: Medicare Other

## 2021-07-15 ENCOUNTER — Inpatient Hospital Stay: Payer: Medicare Other | Attending: Oncology | Admitting: Oncology

## 2021-07-15 ENCOUNTER — Other Ambulatory Visit: Payer: Self-pay

## 2021-07-15 VITALS — BP 145/92 | HR 71 | Temp 97.7°F | Resp 18 | Ht 66.0 in | Wt 137.8 lb

## 2021-07-15 DIAGNOSIS — Z79811 Long term (current) use of aromatase inhibitors: Secondary | ICD-10-CM | POA: Insufficient documentation

## 2021-07-15 DIAGNOSIS — Z170 Estrogen receptor positive status [ER+]: Secondary | ICD-10-CM | POA: Insufficient documentation

## 2021-07-15 DIAGNOSIS — Z800 Family history of malignant neoplasm of digestive organs: Secondary | ICD-10-CM | POA: Insufficient documentation

## 2021-07-15 DIAGNOSIS — C50211 Malignant neoplasm of upper-inner quadrant of right female breast: Secondary | ICD-10-CM | POA: Insufficient documentation

## 2021-07-15 DIAGNOSIS — C50212 Malignant neoplasm of upper-inner quadrant of left female breast: Secondary | ICD-10-CM | POA: Diagnosis not present

## 2021-07-15 DIAGNOSIS — Z801 Family history of malignant neoplasm of trachea, bronchus and lung: Secondary | ICD-10-CM | POA: Diagnosis not present

## 2021-07-15 DIAGNOSIS — Z780 Asymptomatic menopausal state: Secondary | ICD-10-CM | POA: Diagnosis not present

## 2021-07-15 LAB — COMPREHENSIVE METABOLIC PANEL
ALT: 13 U/L (ref 0–44)
AST: 18 U/L (ref 15–41)
Albumin: 4.3 g/dL (ref 3.5–5.0)
Alkaline Phosphatase: 54 U/L (ref 38–126)
Anion gap: 9 (ref 5–15)
BUN: 13 mg/dL (ref 8–23)
CO2: 25 mmol/L (ref 22–32)
Calcium: 9.3 mg/dL (ref 8.9–10.3)
Chloride: 104 mmol/L (ref 98–111)
Creatinine, Ser: 0.85 mg/dL (ref 0.44–1.00)
GFR, Estimated: >60 mL/min (ref >60)
Glucose, Bld: 92 mg/dL (ref 70–99)
Potassium: 4.1 mmol/L (ref 3.5–5.1)
Sodium: 138 mmol/L (ref 135–145)
Total Bilirubin: 0.3 mg/dL (ref 0.3–1.2)
Total Protein: 7 g/dL (ref 6.5–8.1)

## 2021-07-15 LAB — CBC WITH DIFFERENTIAL/PLATELET
Abs Immature Granulocytes: 0 10*3/uL (ref 0.00–0.07)
Basophils Absolute: 0 10*3/uL (ref 0.0–0.1)
Basophils Relative: 0 %
Eosinophils Absolute: 0 10*3/uL (ref 0.0–0.5)
Eosinophils Relative: 0 %
HCT: 36.5 % (ref 36.0–46.0)
Hemoglobin: 12.4 g/dL (ref 12.0–15.0)
Immature Granulocytes: 0 %
Lymphocytes Relative: 30 %
Lymphs Abs: 1.4 10*3/uL (ref 0.7–4.0)
MCH: 32.4 pg (ref 26.0–34.0)
MCHC: 34 g/dL (ref 30.0–36.0)
MCV: 95.3 fL (ref 80.0–100.0)
Monocytes Absolute: 0.5 10*3/uL (ref 0.1–1.0)
Monocytes Relative: 11 %
Neutro Abs: 2.8 10*3/uL (ref 1.7–7.7)
Neutrophils Relative %: 59 %
Platelets: 161 10*3/uL (ref 150–400)
RBC: 3.83 MIL/uL — ABNORMAL LOW (ref 3.87–5.11)
RDW: 11.8 % (ref 11.5–15.5)
WBC: 4.7 10*3/uL (ref 4.0–10.5)
nRBC: 0 % (ref 0.0–0.2)

## 2021-07-15 NOTE — Progress Notes (Signed)
[place]  [phone] Fax:([phone]) [phone]     ID: Jessica Ingram DOB: 06-27-1951  MR#: [id number]  FBP#:[id number]  Patient Care Team: Marda Stalker, PA-C as PCP - General (Family Medicine) Fanny Skates, MD as Consulting Physician (General Surgery) Wanya Bangura, Virgie Dad, MD as Consulting Physician (Oncology) Kyung Rudd, MD as Consulting Physician (Radiation Oncology) Juanita Craver, MD as Consulting Physician (Gastroenterology) Sydnee Levans, MD as Consulting Physician (Dermatology) Azucena Fallen, MD as Consulting Physician (Obstetrics and Gynecology) Elsie Saas, MD as Consulting Physician (Orthopedic Surgery) Delice Bison, Charlestine Massed, NP as Nurse Practitioner (Hematology and Oncology) OTHER MD:   CHIEF COMPLAINT: Estrogen receptor positive breast cancer  CURRENT TREATMENT: Anastrozole   INTERVAL HISTORY: Jessica Ingram returns  today for follow up of her estrogen receptor positive breast cancer.  She continues on anastrozole, which she tolerates well.  Hot flashes are not an issue.  She does have some vaginal dryness.  She does have coconut oil available to use as needed.  Her most recent bone density screening on 12/20/2019 showed a T score of -0.3, which is considered normal.  Since her last visit, she underwent breast MRI on 11/04/2020 showing breast density category D.  There was no evidence of malignancy.  Her most recent mammogram was 05/30/2020.  She tells me she is going to call and schedule an appointment at the [facility] this month.   REVIEW OF SYSTEMS: Jessica Ingram walks about 3 miles most days.  She has not gone back to the gym yet--she stopped going with COVID.  She is doing a lot of babysitting, now with 2 grandchildren, and is planning to build a house and some land she got in [place].  A detailed review of systems today was otherwise entirely benign  BREAST CANCER HISTORY: From the original intake note:  "Jessica Ingram" had bilateral  screening mammography with [facility] 01/01/2017. The breast density was category D. In the right breast superiorly there was a 1.1 cm irregular mass. In the left breast there was an area of possible architectural distortion.  The patient was recalled 01/07/2017 or bilateral diagnostic mammography and bilateral ultrasonography. In the right breast superiorly there was a 1.5 cm irregular mass which was located by sonography in the upper inner quadrant area this measured 1.1 cm.  In the left breast the area of architectural distortion was again noted. By ultrasound this was an irregular mass with indistinct margins in the upper inner quadrant of the left breast.  Biopsy of both the areas in question was performed 01/11/2017. 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 The left breast biopsy showed only fibrocystic changes but this was felt to be discordant by radiology  The patient's subsequent history is as detailed below   PAST MEDICAL HISTORY: Past Medical History:  Diagnosis Date   Arthritis    Genetic testing 02/19/2017   Ms. Overbay underwent genetic counseling and testing for hereditary cancer syndromes on 02/02/2017. Her results were negative for mutations in all 46 genes analyzed by [facility]'s 46-gene Common Hereditary Cancers Panel. Genes analyzed include: APC, ATM, AXIN2, BARD1, BMPR1A, BRCA1, BRCA2, BRIP1, CDH1, CDKN2A, CHEK2, CTNNA1, DICER1, EPCAM, GREM1, HOXB13, KIT, MEN1, MLH1, MSH2, MSH3, MSH6, MUTYH, NBN,   Hepatitis    at age 84   Personal history of radiation  therapy     PAST SURGICAL HISTORY: Past Surgical History:  Procedure Laterality Date   ANTERIOR CRUCIATE LIGAMENT REPAIR Left    AXILLARY SENTINEL NODE BIOPSY Left 03/29/2017    Procedure: LEFT AXILLARY SENTINEL LYMPH NODE BIOPSY, INJECT BLUE DYE LEFT BREAST;  Surgeon: Fanny Skates, MD;  Location: [facility];  Service: General;  Laterality: Left;   BREAST BIOPSY Left 02/05/2017   BREAST BIOPSY Right 2018   BREAST LUMPECTOMY Bilateral 03/05/2017   BREAST LUMPECTOMY WITH RADIOACTIVE SEED AND SENTINEL LYMPH NODE BIOPSY Bilateral 03/05/2017   Procedure: BILATERAL BREAST LUMPECTOMY WITH RADIOACTIVE SEED AND RIGHT AXILLARY SENTINEL LYMPH NODE BIOPSY;  Surgeon: Fanny Skates, MD;  Location: [facility];  Service: General;  Laterality: Bilateral;   FINE NEEDLE ASPIRATION Right 03/29/2017   Procedure: ASPIRATION OF SEROMA RIGHT AXILLA;  Surgeon: Fanny Skates, MD;  Location: [facility];  Service: General;  Laterality: Right;    FAMILY HISTORY Family History  Problem Relation Age of Onset   Lung cancer Mother 49       d.93 history of smoking   Prostate cancer Father 59       d.83 prostate cancer metastasized to bone   Colon cancer Brother 68       d.53   Breast cancer Maternal Aunt 73       d.85s   Colon cancer Maternal Aunt 83   Bone cancer Paternal Aunt        d.85   Prostate cancer Paternal Uncle 32       d.72s metastasized to bladder   Kidney cancer Maternal Grandfather 5       d.53 possible colon cancer   Lung cancer Paternal Uncle 60       d.36   Brain cancer Cousin 1       d.11 paternal first-cousin. Son of uncle with prostate cancer.   Prostate cancer Paternal Grandfather        d.89  The patient's father was diagnosed with prostate cancer at the age of 63. He died from unrelated causes at age 31. The patient's mother was diagnosed with lung cancer at age 22. She was a smoker. She died from unrelated causes at age 57. The patient has one brother, 5 sisters. The brother was diagnosed with colon cancer at age 107 and died at age 18. There is also a maternal aunt diagnosed with both breast and colon cancers in her  27s. There is no history of ovarian cancer in the family   GYNECOLOGIC HISTORY:  No LMP recorded. Patient is postmenopausal. Menarche age 77, first live birth age 44, which increases the risk of breast cancer developing. The patient is GX P2. She went through the change of life age 89 and was on hormone replacement until age 88. She also took oral contraceptives remotely for about 12 years with no complications   SOCIAL HISTORY:  Jessica Ingram is widowed; her husband died from complications of depression in September 2017. 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  Her daughter Jolyne Loa lives in [place] where she works as an Therapist, music, and daughter [place]ble [place] lives in [place] where she works for the baseball program at [employer].  Patient became grandmother to a baby girl in May 2020 and to a second grandchild March 2021. She is not a Ambulance person.    ADVANCED DIRECTIVES:  in place; the patient has named her sister Bevely Palmer as healthcare power of attorney. Rod Holler may be reached at [phone]    HEALTH MAINTENANCE: Social History   Tobacco Use   Smoking status: Never   Smokeless tobacco: Never  Vaping Use   Vaping Use: Never used  Substance Use Topics   Alcohol use: Yes    Comment: 7   Drug use: No     Colonoscopy: January 2017/man  PAP: 2014  Bone density: 08/2017, -0.8   No Known Allergies  Current Outpatient Medications  Medication Sig Dispense Refill   anastrozole (ARIMIDEX) 1 MG tablet TAKE 1 TABLET(1 MG) BY MOUTH DAILY 90 tablet 4   No current facility-administered medications for this visit.    OBJECTIVE: White woman who appears younger than stated age  47:   07/15/21 1408  BP: (!) 145/92  Pulse: 71  Resp: 18  Temp: 97.7 F (36.5 C)  SpO2: 100%     Body mass index is 22.24 kg/m.    Sclerae unicteric, EOMs intact Wearing a mask No  cervical or supraclavicular adenopathy Lungs no rales or rhonchi Heart regular rate and rhythm Abd soft, nontender, positive bowel sounds MSK no focal spinal tenderness, no upper extremity lymphedema Neuro: nonfocal, well oriented, appropriate affect Breasts: Status post bilateral lumpectomies with bilateral radiation.  There is no evidence of local recurrence.  Both axillae are benign.   LAB RESULTS:  CMP     Component Value Date/Time   NA 136 07/09/2020 1408   NA 137 09/21/2017 1054   K 4.1 07/09/2020 1408   K 4.4 09/21/2017 1054   CL 103 07/09/2020 1408   CO2 28 07/09/2020 1408   CO2 25 09/21/2017 1054   GLUCOSE 148 (H) 07/09/2020 1408   GLUCOSE 101 09/21/2017 1054   BUN 10 07/09/2020 1408   BUN 12.6 09/21/2017 1054   CREATININE 0.91 07/09/2020 1408   CREATININE 0.92 12/05/2018 1027   CREATININE 0.8 09/21/2017 1054   CALCIUM 9.3 07/09/2020 1408   CALCIUM 9.7 09/21/2017 1054   PROT 7.1 07/09/2020 1408   PROT 7.8 09/21/2017 1054   ALBUMIN 4.2 07/09/2020 1408   ALBUMIN 4.1 09/21/2017 1054   AST 18 07/09/2020 1408   AST 20 12/05/2018 1027   AST 22 09/21/2017 1054   ALT 11 07/09/2020 1408   ALT 16 12/05/2018 1027   ALT 21 09/21/2017 1054   ALKPHOS 55 07/09/2020 1408   ALKPHOS 81 09/21/2017 1054   BILITOT 0.3 07/09/2020 1408   BILITOT 0.8 12/05/2018 1027   BILITOT 0.45 09/21/2017 1054   GFRNONAA >60 07/09/2020 1408   GFRNONAA >60 12/05/2018 1027   GFRAA >60 07/09/2020 1408   GFRAA >60 12/05/2018 1027    No results found for: TOTALPROTELP, ALBUMINELP, A1GS, A2GS, BETS, BETA2SER, GAMS, MSPIKE, SPEI  No results found for: KPAFRELGTCHN, LAMBDASER, [facility]  Lab Results  Component Value Date   WBC 4.7 07/15/2021   NEUTROABS 2.8 07/15/2021   HGB 12.4 07/15/2021   HCT 36.5 07/15/2021   MCV 95.3 07/15/2021   PLT 161 07/15/2021      Chemistry      Component Value Date/Time   NA 136 07/09/2020 1408   NA 137 09/21/2017 1054   K 4.1 07/09/2020 1408   K 4.4  09/21/2017 1054   CL 103 07/09/2020 1408   CO2 28 07/09/2020 1408   CO2 25 09/21/2017 1054   BUN 10 07/09/2020 1408   BUN 12.6 09/21/2017 1054   CREATININE 0.91 07/09/2020 1408  CREATININE 0.92 12/05/2018 1027   CREATININE 0.8 09/21/2017 1054      Component Value Date/Time   CALCIUM 9.3 07/09/2020 1408   CALCIUM 9.7 09/21/2017 1054   ALKPHOS 55 07/09/2020 1408   ALKPHOS 81 09/21/2017 1054   AST 18 07/09/2020 1408   AST 20 12/05/2018 1027   AST 22 09/21/2017 1054   ALT 11 07/09/2020 1408   ALT 16 12/05/2018 1027   ALT 21 09/21/2017 1054   BILITOT 0.3 07/09/2020 1408   BILITOT 0.8 12/05/2018 1027   BILITOT 0.45 09/21/2017 1054       No results found for: LABCA2  No components found for: [id number]  No results for input(s): INR in the last 168 hours.  Urinalysis No results found for: COLORURINE, APPEARANCEUR, LABSPEC, PHURINE, GLUCOSEU, HGBUR, BILIRUBINUR, KETONESUR, PROTEINUR, UROBILINOGEN, NITRITE, LEUKOCYTESUR   STUDIES: No results found.   ELIGIBLE FOR AVAILABLE RESEARCH PROTOCOL: no  ASSESSMENT: 70 y.o. [place] woman status post right breast upper inner quadrant biopsy 01/11/2017 for a clinical  T1c N0, stage IA invasive ductal carcinoma, grade 1, estrogen and progesterone receptor positive, HER-2 nonamplified, with an MIB-1 of 5%.   (1) Left lumpectomy and sentinel lymph node sampling 03/05/2017 showed a pT1c pN0 invasive ductal carcinoma, grade 1, with close but negative margins, estrogen receptor strongly positive, progesterone receptor negative, with an MIB-1 of 5% and no HER-2 amplification.  (a) left axillary sentinel lymph node biopsy 03/29/2017 showed no lymph node involvement  (2) right lumpectomy 03/05/2017 showed a pT1c pN0 invasive ductal carcinoma, grade 1, with negative margins  (3) Oncotype DX scores of 19-21 predicts a 10 year risk of recurrence outside the breast of 12-13% if the patient's only systemic therapy is tamoxifen for 5 years. It also  predicts no benefit from chemotherapy.  (4) adjuvant radiation completed 05/26/2017  (5) started anastrozole 07/15/2017  (a) bone density at the [facility] 09/22/2017 showed a T score of -0.8  (b) bone density 12/20/2019 shows a T score of -0.3, normal  (6) genetics testing 02/15/2017 through [facility]'s Common Hereditary Cancers Panel showed no deleterious mutations in APC, ATM, AXIN2, BARD1, BMPR1A, BRCA1, BRCA2, BRIP1, CDH1, CDKN2A, CHEK2, CTNNA1, DICER1, EPCAM, GREM1, HOXB13, KIT, MEN1, MLH1, MSH2, MSH3, MSH6, MUTYH, NBN, NF1, NTHL1, PALB2, PDGFRA, PMS2, POLD1, POLE, PTEN, RAD50, RAD51C, RAD51D, SDHA, SDHB, SDHC, SDHD, SMAD4, SMARCA4, STK11, TP53, TSC1, TSC2, and VHL.   (a) Variants of uncertain significance (VUSs) were identified in three genes.    DICER1 c.2027G>C (p.Arg676Pro)   TSC2 c.4325A>T (p.Glu1442Val)   CTNNA1 c.410G>A (p.Arg137Gln)  (7) breast density category D  (a) yearly breast MRI alternating with yearly mammography   PLAN: Jessica Ingram is now 4-1/2 years out from definitive surgery for her breast cancer with no evidence of disease recurrence.  This is very favorable.  She is tolerating anastrozole well and the plan is to continue that for 5 years which means at the next visit she will come off that medication  She has tolerated it well with normal bone density doubtless because of her excellent walking program.  Her breast density continues to be extremely high.  Mammography is not going to be helpful except of course to show calcification patterns.  She will need yearly MRI of the breast alternating with mammography until the breast density decreases to a minimum of C, preferably B.   Accordingly I have set her up for a breast MRI March 2023, and repeat visit here in October 2023 after her next years mammogram in September  Total encounter time 70mnutes.*  Adelae Yodice, GVirgie Dad MD  07/15/21 2:21 PM [facility] [address]Tel. [phone]   Fax. [phone]  I, KWilburn Mylar am acting as scribe for Dr. GVirgie Dad Uziel Covault.  I, GLurline DelMD, have reviewed the above documentation for accuracy and completeness, and I agree with the above.   *Total Encounter Time as defined by the Centers for Medicare and Medicaid Services includes, in addition to the face-to-face time of a patient visit (documented in the note above) non-face-to-face time: obtaining and reviewing outside history, ordering and reviewing medications, tests or procedures, care coordination (communications with other health care professionals or caregivers) and documentation in the medical record.

## 2021-08-04 ENCOUNTER — Other Ambulatory Visit: Payer: Self-pay | Admitting: Oncology

## 2021-08-04 DIAGNOSIS — Z853 Personal history of malignant neoplasm of breast: Secondary | ICD-10-CM

## 2021-09-04 DIAGNOSIS — Z23 Encounter for immunization: Secondary | ICD-10-CM | POA: Diagnosis not present

## 2021-09-05 ENCOUNTER — Ambulatory Visit
Admission: RE | Admit: 2021-09-05 | Discharge: 2021-09-05 | Disposition: A | Discharge status: Home / Self Care | Payer: Medicare Other | Source: Ambulatory Visit | Attending: Oncology | Admitting: Oncology

## 2021-09-05 ENCOUNTER — Other Ambulatory Visit: Payer: Self-pay

## 2021-09-05 DIAGNOSIS — R922 Inconclusive mammogram: Secondary | ICD-10-CM | POA: Diagnosis not present

## 2021-09-05 DIAGNOSIS — Z853 Personal history of malignant neoplasm of breast: Secondary | ICD-10-CM

## 2021-09-05 HISTORY — DX: Malignant (primary) neoplasm, unspecified: C80.1

## 2021-09-05 HISTORY — DX: Malignant neoplasm of unspecified site of unspecified female breast: C50.919

## 2021-12-09 ENCOUNTER — Other Ambulatory Visit: Payer: Self-pay | Admitting: Oncology

## 2021-12-09 ENCOUNTER — Other Ambulatory Visit: Payer: Self-pay | Admitting: Hematology and Oncology

## 2021-12-09 DIAGNOSIS — Z170 Estrogen receptor positive status [ER+]: Secondary | ICD-10-CM

## 2021-12-09 DIAGNOSIS — C50212 Malignant neoplasm of upper-inner quadrant of left female breast: Secondary | ICD-10-CM

## 2021-12-09 DIAGNOSIS — C50211 Malignant neoplasm of upper-inner quadrant of right female breast: Secondary | ICD-10-CM

## 2021-12-23 DIAGNOSIS — Z01419 Encounter for gynecological examination (general) (routine) without abnormal findings: Secondary | ICD-10-CM | POA: Diagnosis not present

## 2021-12-23 DIAGNOSIS — Z01411 Encounter for gynecological examination (general) (routine) with abnormal findings: Secondary | ICD-10-CM | POA: Diagnosis not present

## 2021-12-23 DIAGNOSIS — Z853 Personal history of malignant neoplasm of breast: Secondary | ICD-10-CM | POA: Diagnosis not present

## 2021-12-23 DIAGNOSIS — Z124 Encounter for screening for malignant neoplasm of cervix: Secondary | ICD-10-CM | POA: Diagnosis not present

## 2021-12-25 DIAGNOSIS — D485 Neoplasm of uncertain behavior of skin: Secondary | ICD-10-CM | POA: Diagnosis not present

## 2021-12-25 DIAGNOSIS — L821 Other seborrheic keratosis: Secondary | ICD-10-CM | POA: Diagnosis not present

## 2021-12-25 DIAGNOSIS — L814 Other melanin hyperpigmentation: Secondary | ICD-10-CM | POA: Diagnosis not present

## 2021-12-25 DIAGNOSIS — D1801 Hemangioma of skin and subcutaneous tissue: Secondary | ICD-10-CM | POA: Diagnosis not present

## 2021-12-25 DIAGNOSIS — D2262 Melanocytic nevi of left upper limb, including shoulder: Secondary | ICD-10-CM | POA: Diagnosis not present

## 2021-12-25 DIAGNOSIS — C44519 Basal cell carcinoma of skin of other part of trunk: Secondary | ICD-10-CM | POA: Diagnosis not present

## 2021-12-29 ENCOUNTER — Other Ambulatory Visit: Payer: Self-pay

## 2021-12-29 ENCOUNTER — Ambulatory Visit
Admission: RE | Admit: 2021-12-29 | Discharge: 2021-12-29 | Disposition: A | Discharge status: Home / Self Care | Payer: Medicare Other | Source: Ambulatory Visit | Attending: Hematology and Oncology | Admitting: Hematology and Oncology

## 2021-12-29 DIAGNOSIS — C50212 Malignant neoplasm of upper-inner quadrant of left female breast: Secondary | ICD-10-CM

## 2021-12-29 DIAGNOSIS — Z170 Estrogen receptor positive status [ER+]: Secondary | ICD-10-CM

## 2021-12-29 DIAGNOSIS — Z1239 Encounter for other screening for malignant neoplasm of breast: Secondary | ICD-10-CM | POA: Diagnosis not present

## 2021-12-29 DIAGNOSIS — C50211 Malignant neoplasm of upper-inner quadrant of right female breast: Secondary | ICD-10-CM

## 2021-12-29 MED ORDER — GADOBUTROL 1 MMOL/ML IV SOLN
6.0000 mL | Freq: Once | INTRAVENOUS | Status: AC | PRN
  Administered 2021-12-29: 6 mL via INTRAVENOUS

## 2022-01-01 DIAGNOSIS — Z20822 Contact with and (suspected) exposure to covid-19: Secondary | ICD-10-CM | POA: Diagnosis not present

## 2022-01-05 ENCOUNTER — Other Ambulatory Visit: Payer: Self-pay

## 2022-01-05 MED ORDER — ANASTROZOLE 1 MG PO TABS: 1.0000 mg | ORAL_TABLET | Freq: Every day | ORAL | 3 refills | Status: AC

## 2022-01-08 DIAGNOSIS — C44519 Basal cell carcinoma of skin of other part of trunk: Secondary | ICD-10-CM | POA: Diagnosis not present

## 2022-07-28 ENCOUNTER — Ambulatory Visit: Payer: Medicare Other | Admitting: Hematology and Oncology

## 2022-07-28 ENCOUNTER — Other Ambulatory Visit: Payer: Medicare Other

## 2022-08-03 ENCOUNTER — Inpatient Hospital Stay: Payer: Medicare Other | Attending: Hematology and Oncology

## 2022-08-03 ENCOUNTER — Other Ambulatory Visit: Payer: Self-pay | Admitting: *Deleted

## 2022-08-03 ENCOUNTER — Inpatient Hospital Stay (HOSPITAL_BASED_OUTPATIENT_CLINIC_OR_DEPARTMENT_OTHER): Payer: Medicare Other | Admitting: Hematology and Oncology

## 2022-08-03 ENCOUNTER — Encounter: Payer: Self-pay | Admitting: Hematology and Oncology

## 2022-08-03 VITALS — BP 136/95 | HR 64 | Temp 97.7°F | Resp 16 | Ht 66.0 in | Wt 134.6 lb

## 2022-08-03 DIAGNOSIS — Z801 Family history of malignant neoplasm of trachea, bronchus and lung: Secondary | ICD-10-CM | POA: Diagnosis not present

## 2022-08-03 DIAGNOSIS — Z170 Estrogen receptor positive status [ER+]: Secondary | ICD-10-CM

## 2022-08-03 DIAGNOSIS — C50211 Malignant neoplasm of upper-inner quadrant of right female breast: Secondary | ICD-10-CM

## 2022-08-03 DIAGNOSIS — Z923 Personal history of irradiation: Secondary | ICD-10-CM | POA: Insufficient documentation

## 2022-08-03 DIAGNOSIS — Z8051 Family history of malignant neoplasm of kidney: Secondary | ICD-10-CM | POA: Insufficient documentation

## 2022-08-03 DIAGNOSIS — Z79811 Long term (current) use of aromatase inhibitors: Secondary | ICD-10-CM | POA: Insufficient documentation

## 2022-08-03 DIAGNOSIS — Z803 Family history of malignant neoplasm of breast: Secondary | ICD-10-CM | POA: Diagnosis not present

## 2022-08-03 DIAGNOSIS — Z853 Personal history of malignant neoplasm of breast: Secondary | ICD-10-CM | POA: Insufficient documentation

## 2022-08-03 DIAGNOSIS — Z800 Family history of malignant neoplasm of digestive organs: Secondary | ICD-10-CM | POA: Insufficient documentation

## 2022-08-03 DIAGNOSIS — Z8042 Family history of malignant neoplasm of prostate: Secondary | ICD-10-CM | POA: Diagnosis not present

## 2022-08-03 LAB — CMP (CANCER CENTER ONLY)
ALT: 14 U/L (ref 0–44)
AST: 19 U/L (ref 15–41)
Albumin: 4.8 g/dL (ref 3.5–5.0)
Alkaline Phosphatase: 53 U/L (ref 38–126)
Anion gap: 7 (ref 5–15)
BUN: 12 mg/dL (ref 8–23)
CO2: 28 mmol/L (ref 22–32)
Calcium: 9.6 mg/dL (ref 8.9–10.3)
Chloride: 100 mmol/L (ref 98–111)
Creatinine: 0.9 mg/dL (ref 0.44–1.00)
GFR, Estimated: >60 mL/min (ref >60)
Glucose, Bld: 105 mg/dL — ABNORMAL HIGH (ref 70–99)
Potassium: 4.6 mmol/L (ref 3.5–5.1)
Sodium: 135 mmol/L (ref 135–145)
Total Bilirubin: 0.7 mg/dL (ref 0.3–1.2)
Total Protein: 7.8 g/dL (ref 6.5–8.1)

## 2022-08-03 LAB — CBC WITH DIFFERENTIAL (CANCER CENTER ONLY)
Abs Immature Granulocytes: 0.01 10*3/uL (ref 0.00–0.07)
Basophils Absolute: 0 10*3/uL (ref 0.0–0.1)
Basophils Relative: 0 %
Eosinophils Absolute: 0 10*3/uL (ref 0.0–0.5)
Eosinophils Relative: 1 %
HCT: 39.6 % (ref 36.0–46.0)
Hemoglobin: 13.6 g/dL (ref 12.0–15.0)
Immature Granulocytes: 0 %
Lymphocytes Relative: 22 %
Lymphs Abs: 1.2 10*3/uL (ref 0.7–4.0)
MCH: 32.8 pg (ref 26.0–34.0)
MCHC: 34.3 g/dL (ref 30.0–36.0)
MCV: 95.4 fL (ref 80.0–100.0)
Monocytes Absolute: 0.5 10*3/uL (ref 0.1–1.0)
Monocytes Relative: 9 %
Neutro Abs: 3.7 10*3/uL (ref 1.7–7.7)
Neutrophils Relative %: 68 %
Platelet Count: 175 10*3/uL (ref 150–400)
RBC: 4.15 MIL/uL (ref 3.87–5.11)
RDW: 12 % (ref 11.5–15.5)
WBC Count: 5.4 10*3/uL (ref 4.0–10.5)
nRBC: 0 % (ref 0.0–0.2)

## 2022-08-03 NOTE — Progress Notes (Signed)
Jessica Ingram  [phone]) [phone] Fax:(336) [phone]     ID: [name] DOB: November 02, 1950  MR#: [id number]  EXN#:[id number]  Patient Care Team: Jessica Stalker, PA-C as PCP - General (Family Medicine) Jessica Skates, MD as Consulting Physician (General Surgery) Jessica Rudd, MD as Consulting Physician (Radiation Oncology) Jessica Craver, MD as Consulting Physician (Gastroenterology) Jessica Levans, MD as Consulting Physician (Dermatology) Jessica Fallen, MD as Consulting Physician (Obstetrics and Gynecology) Jessica Saas, MD as Consulting Physician (Orthopedic Surgery) Jessica Ingram, Jessica Massed, NP as Nurse Practitioner (Hematology and Oncology) Jessica Pike, MD as Consulting Physician (Hematology and Oncology) OTHER MD:   CHIEF COMPLAINT: Estrogen receptor positive breast cancer  CURRENT TREATMENT: Anastrozole   INTERVAL HISTORY:  Jessica Ingram returns  today for follow up of her estrogen receptor positive breast cancer. She has now completed 5 years of anastrozole but was wondering if she needs to take this more years of anastrozole since she has heard from friends and family members about taking it for longer.  She is tolerating it very well, she denies any adverse effects.  Her last bone density in 2021 was normal.  She also undergoes MRIs alternating with mammograms given her breast density.  She denies any changes in her breast.  Rest of the pertinent 10 point ROS reviewed and negative  BREAST CANCER HISTORY: From the original intake note:  "Jessica Ingram" had bilateral screening mammography with tomography at [facility] 01/01/2017. The breast density was category D. In the right breast superiorly there was a 1.1 cm irregular mass. In the left breast there was an area of possible architectural distortion.  The patient was recalled 01/07/2017 or bilateral diagnostic mammography and bilateral ultrasonography. In the right breast superiorly there was a 1.5 cm irregular mass  which was located by sonography in the upper inner quadrant area this measured 1.1 cm.  In the left breast the area of architectural distortion was again noted. By ultrasound this was an irregular mass with indistinct margins in the upper inner quadrant of the left breast.  Biopsy of both the areas in question was performed 01/11/2017. 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 The left breast biopsy showed only fibrocystic changes but this was felt to be discordant by radiology  The patient's subsequent history is as detailed below   PAST MEDICAL HISTORY: Past Medical History:  Diagnosis Date   Arthritis    Breast cancer ([place])    Cancer ([place])    Genetic testing 02/19/2017   Ms. Ogburn underwent genetic counseling and testing for hereditary cancer syndromes on 02/02/2017. Her results were negative for mutations in all 46 genes analyzed by [facility]'s 46-gene Common Hereditary Cancers Panel. Genes analyzed include: APC, ATM, AXIN2, BARD1, BMPR1A, BRCA1, BRCA2, BRIP1, CDH1, CDKN2A, CHEK2, CTNNA1, DICER1, EPCAM, GREM1, HOXB13, KIT, MEN1, MLH1, MSH2, MSH3, MSH6, MUTYH, NBN,   Hepatitis    at age 14   Personal history of radiation therapy     PAST SURGICAL HISTORY: Past Surgical History:  Procedure Laterality Date   ANTERIOR CRUCIATE LIGAMENT REPAIR Left    AXILLARY SENTINEL NODE BIOPSY Left 03/29/2017   Procedure: LEFT AXILLARY SENTINEL LYMPH NODE BIOPSY, INJECT BLUE DYE LEFT BREAST;  Surgeon: Jessica Skates, MD;  Location: [facility];  Service: General;  Laterality: Left;   BREAST BIOPSY  Left 02/05/2017   BREAST BIOPSY Right 2018   BREAST LUMPECTOMY Bilateral 03/05/2017   BREAST LUMPECTOMY WITH RADIOACTIVE SEED AND SENTINEL LYMPH NODE BIOPSY  Bilateral 03/05/2017   Procedure: BILATERAL BREAST LUMPECTOMY WITH RADIOACTIVE SEED AND RIGHT AXILLARY SENTINEL LYMPH NODE BIOPSY;  Surgeon: Jessica Skates, MD;  Location: [facility];  Service: General;  Laterality: Bilateral;   FINE NEEDLE ASPIRATION Right 03/29/2017   Procedure: ASPIRATION OF SEROMA RIGHT AXILLA;  Surgeon: Jessica Skates, MD;  Location: [facility];  Service: General;  Laterality: Right;    FAMILY HISTORY Family History  Problem Relation Age of Onset   Lung cancer Mother 36       d.93 history of smoking   Prostate cancer Father 64       d.83 prostate cancer metastasized to bone   Colon cancer Brother 79       d.53   Breast cancer Maternal Aunt 73       d.85s   Colon cancer Maternal Aunt 83   Bone cancer Paternal Aunt        d.85   Prostate cancer Paternal Uncle 94       d.72s metastasized to bladder   Kidney cancer Maternal Grandfather 70       d.53 possible colon cancer   Lung cancer Paternal Uncle 60       d.67   Brain cancer Cousin 88       d.11 paternal first-cousin. Son of uncle with prostate cancer.   Prostate cancer Paternal Grandfather        d.89  The patient's father was diagnosed with prostate cancer at the age of 16. He died from unrelated causes at age 98. The patient's mother was diagnosed with lung cancer at age 50. She was a smoker. She died from unrelated causes at age 10. The patient has one brother, 5 sisters. The brother was diagnosed with colon cancer at age 71 and died at age 45. There is also a maternal aunt diagnosed with both breast and colon cancers in her 55s. There is no history of ovarian cancer in the family   GYNECOLOGIC HISTORY:  No LMP recorded. Patient is postmenopausal. Menarche age 68, first live birth age 71, which increases the risk of breast cancer developing. The patient is GX P2. She went through the change of life age 13 and was on hormone replacement until age 39. She also took oral  contraceptives remotely for about 12 years with no complications   SOCIAL HISTORY:  Jessica Ingram is widowed; her husband died from complications of depression in September 2017. 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  Her daughter Jolyne Loa lives in [place] where she works as an Therapist, music, and daughter [place] lives in [place] where she works for the baseball program at [employer].  Patient became grandmother to a baby girl in May 2020 and to a second grandchild March 2021. She is not a Ambulance person.    ADVANCED DIRECTIVES:  in place; the patient has named her sister Bevely Palmer as healthcare power of attorney. Rod Holler may be reached at [phone]    HEALTH MAINTENANCE: Social History   Tobacco Use   Smoking status: Never   Smokeless tobacco: Never  Vaping Use   Vaping Use: Never used  Substance Use Topics   Alcohol use: Yes    Comment: 7  Drug use: No     Colonoscopy: January 2017/man  PAP: 2014  Bone density: 08/2017, -0.8   No Known Allergies  Current Outpatient Medications  Medication Sig Dispense Refill   anastrozole (ARIMIDEX) 1 MG tablet Take 1 tablet (1 mg total) by mouth daily. 90 tablet 3   No current facility-administered medications for this visit.    OBJECTIVE: White woman who appears younger than stated age  85:   08/03/22 0901  BP: (!) 136/95  Pulse: 64  Resp: 16  Temp: 97.7 F (36.5 C)  SpO2: 100%     Body mass index is 21.73 kg/m.    Physical Exam Constitutional:      Appearance: Normal appearance.  Cardiovascular:     Rate and Rhythm: Normal rate and regular rhythm.  Pulmonary:     Effort: Pulmonary effort is normal.     Breath sounds: Normal breath sounds.  Chest:     Comments: Bilateral breasts inspected and palpated.  No palpable masses or regional adenopathy.  As mentioned, she does have dense breasts. Abdominal:      General: Abdomen is flat.     Palpations: Abdomen is soft.  Musculoskeletal:     Cervical back: Normal range of motion and neck supple. No rigidity.  Lymphadenopathy:     Cervical: No cervical adenopathy.  Skin:    General: Skin is warm and dry.  Neurological:     General: No focal deficit present.     Mental Status: She is alert.     LAB RESULTS:  CMP     Component Value Date/Time   NA 138 07/15/2021 1359   NA 137 09/21/2017 1054   K 4.1 07/15/2021 1359   K 4.4 09/21/2017 1054   CL 104 07/15/2021 1359   CO2 25 07/15/2021 1359   CO2 25 09/21/2017 1054   GLUCOSE 92 07/15/2021 1359   GLUCOSE 101 09/21/2017 1054   BUN 13 07/15/2021 1359   BUN 12.6 09/21/2017 1054   CREATININE 0.85 07/15/2021 1359   CREATININE 0.92 12/05/2018 1027   CREATININE 0.8 09/21/2017 1054   CALCIUM 9.3 07/15/2021 1359   CALCIUM 9.7 09/21/2017 1054   PROT 7.0 07/15/2021 1359   PROT 7.8 09/21/2017 1054   ALBUMIN 4.3 07/15/2021 1359   ALBUMIN 4.1 09/21/2017 1054   AST 18 07/15/2021 1359   AST 20 12/05/2018 1027   AST 22 09/21/2017 1054   ALT 13 07/15/2021 1359   ALT 16 12/05/2018 1027   ALT 21 09/21/2017 1054   ALKPHOS 54 07/15/2021 1359   ALKPHOS 81 09/21/2017 1054   BILITOT 0.3 07/15/2021 1359   BILITOT 0.8 12/05/2018 1027   BILITOT 0.45 09/21/2017 1054   GFRNONAA >60 07/15/2021 1359   GFRNONAA >60 12/05/2018 1027   GFRAA >60 07/09/2020 1408   GFRAA >60 12/05/2018 1027    No results found for: "TOTALPROTELP", "ALBUMINELP", "A1GS", "A2GS", "BETS", "BETA2SER", "GAMS", "MSPIKE", "SPEI"  No results found for: "KPAFRELGTCHN", "LAMBDASER", "KAPLAMBRATIO"  Lab Results  Component Value Date   WBC 5.4 08/03/2022   NEUTROABS 3.7 08/03/2022   HGB 13.6 08/03/2022   HCT 39.6 08/03/2022   MCV 95.4 08/03/2022   PLT 175 08/03/2022      Chemistry      Component Value Date/Time   NA 138 07/15/2021 1359   NA 137 09/21/2017 1054   K 4.1 07/15/2021 1359   K 4.4 09/21/2017 1054   CL 104  07/15/2021 1359   CO2 25 07/15/2021 1359   CO2 25 09/21/2017  1054   BUN 13 07/15/2021 1359   BUN 12.6 09/21/2017 1054   CREATININE 0.85 07/15/2021 1359   CREATININE 0.92 12/05/2018 1027   CREATININE 0.8 09/21/2017 1054      Component Value Date/Time   CALCIUM 9.3 07/15/2021 1359   CALCIUM 9.7 09/21/2017 1054   ALKPHOS 54 07/15/2021 1359   ALKPHOS 81 09/21/2017 1054   AST 18 07/15/2021 1359   AST 20 12/05/2018 1027   AST 22 09/21/2017 1054   ALT 13 07/15/2021 1359   ALT 16 12/05/2018 1027   ALT 21 09/21/2017 1054   BILITOT 0.3 07/15/2021 1359   BILITOT 0.8 12/05/2018 1027   BILITOT 0.45 09/21/2017 1054       No results found for: "LABCA2"  No components found for: "IRJJOA416"  No results for input(s): "INR" in the last 168 hours.  Urinalysis No results found for: "COLORURINE", "APPEARANCEUR", "LABSPEC", "PHURINE", "GLUCOSEU", "HGBUR", "BILIRUBINUR", "KETONESUR", "PROTEINUR", "UROBILINOGEN", "NITRITE", "LEUKOCYTESUR"   STUDIES: No results found.   ELIGIBLE FOR AVAILABLE RESEARCH PROTOCOL: no  ASSESSMENT: 71 y.o. [place] woman status post right breast upper inner quadrant biopsy 01/11/2017 for a clinical  T1c N0, stage IA invasive ductal carcinoma, grade 1, estrogen and progesterone receptor positive, HER-2 nonamplified, with an MIB-1 of 5%.   (1) Left lumpectomy and sentinel lymph node sampling 03/05/2017 showed a pT1c pN0 invasive ductal carcinoma, grade 1, with close but negative margins, estrogen receptor strongly positive, progesterone receptor negative, with an MIB-1 of 5% and no HER-2 amplification.  (a) left axillary sentinel lymph node biopsy 03/29/2017 showed no lymph node involvement  (2) right lumpectomy 03/05/2017 showed a pT1c pN0 invasive ductal carcinoma, grade 1, with negative margins  (3) Oncotype DX scores of 19-21 predicts a 10 year risk of recurrence outside the breast of 12-13% if the patient's only systemic therapy is tamoxifen for 5 years. It  also predicts no benefit from chemotherapy.  (4) adjuvant radiation completed 05/26/2017  (5) started anastrozole 07/15/2017  (a) [facility] 09/22/2017 showed a T score of -0.8  (b) bone density 12/20/2019 shows a T score of -0.3, normal  (6) genetics testing 02/15/2017 through [facility]'s Common Hereditary Cancers Panel showed no deleterious mutations in APC, ATM, AXIN2, BARD1, BMPR1A, BRCA1, BRCA2, BRIP1, CDH1, CDKN2A, CHEK2, CTNNA1, DICER1, EPCAM, GREM1, HOXB13, KIT, MEN1, MLH1, MSH2, MSH3, MSH6, MUTYH, NBN, NF1, NTHL1, PALB2, PDGFRA, PMS2, POLD1, POLE, PTEN, RAD50, RAD51C, RAD51D, SDHA, SDHB, SDHC, SDHD, SMAD4, SMARCA4, STK11, TP53, TSC1, TSC2, and VHL.   (a) Variants of uncertain significance (VUSs) were identified in three genes.    DICER1 c.2027G>C (p.Arg676Pro)   TSC2 c.4325A>T (p.Glu1442Val)   CTNNA1 c.410G>A (p.Arg137Gln)  (7) breast density category D  (a) yearly breast MRI alternating with yearly mammography   PLAN:. Patient is wondering if she would like to continue anastrozole beyond 5 years.  We have reviewed her original pathology which showed grade 1 bilateral breast cancers, ER positive PR negative.  We have discussed about 5 years versus 7 years versus 10 years of antiestrogen therapy.  We also talked about breast cancer index which she is willing to move forward with which can help [place] decide with the extent of benefit in patients with prolonged antiestrogen therapy. She otherwise will continue doing MRIs alternating with mammograms, her last MRI was in March which was unremarkable, have ordered her mammogram.  Physical examination without any obvious masses or regional adenopathy.  She will return to clinic in 1 year or sooner as needed  Total time spent: 30 minutes  *Total Encounter Time as defined by the Centers for Medicare and Medicaid Services includes, in addition to the face-to-face time of a patient visit (documented in the note above)  non-face-to-face time: obtaining and reviewing outside history, ordering and reviewing medications, tests or procedures, care coordination (communications with other health care professionals or caregivers) and documentation in the medical record.

## 2022-08-04 ENCOUNTER — Telehealth: Payer: Self-pay | Admitting: Hematology and Oncology

## 2022-08-04 NOTE — Telephone Encounter (Signed)
Contacted patient to scheduled appointments. Left message with appointment details and a call back number if patient had any questions or could not accommodate the time we provided.   

## 2022-08-14 DIAGNOSIS — C50211 Malignant neoplasm of upper-inner quadrant of right female breast: Secondary | ICD-10-CM | POA: Diagnosis not present

## 2022-08-21 DIAGNOSIS — Z23 Encounter for immunization: Secondary | ICD-10-CM | POA: Diagnosis not present

## 2022-09-01 ENCOUNTER — Telehealth: Payer: Medicare Other | Admitting: Hematology and Oncology

## 2022-09-01 ENCOUNTER — Inpatient Hospital Stay: Payer: Medicare Other | Attending: Hematology and Oncology | Admitting: Hematology and Oncology

## 2022-09-01 DIAGNOSIS — C50211 Malignant neoplasm of upper-inner quadrant of right female breast: Secondary | ICD-10-CM

## 2022-09-01 DIAGNOSIS — Z170 Estrogen receptor positive status [ER+]: Secondary | ICD-10-CM

## 2022-09-01 NOTE — Progress Notes (Signed)
[place]  Telephone:(336) [phone] Fax:(336) [phone]     ID: MARTHA ELLERBY DOB: 01-12-1951  MR#: [id number]  TGG#:[id number]  Patient Care Team: Marda Stalker, PA-C as PCP - General (Family Medicine) Fanny Skates, MD as Consulting Physician (General Surgery) Kyung Rudd, MD as Consulting Physician (Radiation Oncology) Juanita Craver, MD as Consulting Physician (Gastroenterology) Sydnee Levans, MD as Consulting Physician (Dermatology) Azucena Fallen, MD as Consulting Physician (Obstetrics and Gynecology) Elsie Saas, MD as Consulting Physician (Orthopedic Surgery) Causey, Charlestine Massed, NP as Nurse Practitioner (Hematology and Oncology) Benay Pike, MD as Consulting Physician (Hematology and Oncology) OTHER MD:   CHIEF COMPLAINT: Estrogen receptor positive breast cancer  CURRENT TREATMENT: Anastrozole   INTERVAL HISTORY:   Ms. Bouffard was actually scheduled for telephone visit today however did not answer the phone call twice.  The phone went straight to voicemail and I was able to leave a voicemail.  BREAST CANCER HISTORY: From the original intake note:  "Gay Filler" had bilateral screening mammography with [facility]01[facility]2017. The breast density was category D. In the right breast superiorly there was a 1.1 cm irregular mass. In the left breast there was an area of possible architectural distortion.  The patient was recalled 01/07/2017 or bilateral diagnostic mammography and bilateral ultrasonography. In the right breast superiorly there was a 1.5 cm irregular mass which was located by sonography in the upper inner quadrant area this measured 1.1 cm.  In the left breast the area of architectural distortion was again noted. By ultrasound this was an irregular mass with indistinct margins in the upper inner quadrant of the left breast.  Biopsy of both the areas in question was performed 01/11/2017. The right breast mass was an invasive  ductal carcinoma, grade 1 estrogen receptor 100% positive, progesterone receptor 95% positive, both with strong staining intensity, with an MIB-1 of 5%, and no HER-2 amplification, the signals ratio being 1.30 and the number per cell 1.95. The left breast biopsy showed only fibrocystic changes but this was felt to be discordant by radiology  The patient's subsequent history is as detailed below   PAST MEDICAL HISTORY: Past Medical History:  Diagnosis Date   Arthritis    Breast cancer ([place])    Cancer ([place])    Genetic testing 02/19/2017   Ms. Bostock underwent genetic counseling and testing for hereditary cancer syndromes on 02/02/2017. Her results were negative for mutations in all 46 genes analyzed by [facility]'s 46-gene Common Hereditary Cancers Panel. Genes analyzed include: APC, ATM, AXIN2, BARD1, BMPR1A, BRCA1, BRCA2, BRIP1, CDH1, CDKN2A, CHEK2, CTNNA1, DICER1, EPCAM, GREM1, HOXB13, KIT, MEN1, MLH1, MSH2, MSH3, MSH6, MUTYH, NBN,   Hepatitis    at age 17   Personal history of radiation therapy     PAST SURGICAL HISTORY: Past Surgical History:  Procedure Laterality Date   ANTERIOR CRUCIATE LIGAMENT REPAIR Left    AXILLARY SENTINEL NODE BIOPSY Left 03/29/2017   Procedure: LEFT AXILLARY SENTINEL LYMPH NODE BIOPSY, INJECT BLUE DYE LEFT BREAST;  Surgeon: Fanny Skates, MD;  Location: [place];  Service: General;  Laterality: Left;   BREAST BIOPSY Left 02/05/2017   BREAST BIOPSY Right 2018   BREAST LUMPECTOMY Bilateral 03/05/2017   BREAST LUMPECTOMY WITH RADIOACTIVE SEED AND SENTINEL LYMPH NODE BIOPSY Bilateral 03/05/2017   Procedure: BILATERAL BREAST LUMPECTOMY WITH RADIOACTIVE SEED AND RIGHT AXILLARY SENTINEL LYMPH NODE BIOPSY;  Surgeon: Fanny Skates, MD;  Location: [place];  Service: General;  Laterality: Bilateral;   FINE NEEDLE ASPIRATION Right 03/29/2017  Procedure: ASPIRATION OF SEROMA RIGHT AXILLA;  Surgeon: Fanny Skates, MD;  Location: [place];  Service: General;  Laterality: Right;    FAMILY HISTORY Family History  Problem Relation Age of Onset   Lung cancer Mother 45       d.93 history of smoking   Prostate cancer Father 26       d.83 prostate cancer metastasized to bone   Colon cancer Brother 76       d.53   Breast cancer Maternal Aunt 73       d.85s   Colon cancer Maternal Aunt 83   Bone cancer Paternal Aunt        d.85   Prostate cancer Paternal Uncle 40       d.72s metastasized to bladder   Kidney cancer Maternal Grandfather 51       d.53 possible colon cancer   Lung cancer Paternal Uncle 60       d.80   Brain cancer Cousin 29       d.11 paternal first-cousin. Son of uncle with prostate cancer.   Prostate cancer Paternal Grandfather        d.89  The patient's father was diagnosed with prostate cancer at the age of 59. He died from unrelated causes at age 73. The patient's mother was diagnosed with lung cancer at age 45. She was a smoker. She died from unrelated causes at age 6. The patient has one brother, 5 sisters. The brother was diagnosed with colon cancer at age 31 and died at age 23. There is also a maternal aunt diagnosed with both breast and colon cancers in her 61s. There is no history of ovarian cancer in the family   GYNECOLOGIC HISTORY:  No LMP recorded. Patient is postmenopausal. Menarche age 89, first live birth age 63, which increases the risk of breast cancer developing. The patient is GX P2. She went through the change of life age 45 and was on hormone replacement until age 17. She also took oral contraceptives remotely for about 12 years with no complications   SOCIAL HISTORY:  Gay Filler is widowed; her husband died from complications of depression in September 2017. 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  Her daughter Jolyne Loa lives in [place] where she works as an Secretary/administrator, and daughter Syble Creek lives in [place] where she works for the baseball program at [employer].  Patient became grandmother to a baby girl in May 2020 and to a second grandchild March 2021. She is not a Ambulance person.    ADVANCED DIRECTIVES:  in place; the patient has named her sister Bevely [place] as healthcare power of attorney. Rod Holler may be reached at [phone]    HEALTH MAINTENANCE: Social History   Tobacco Use   Smoking status: Never   Smokeless tobacco: Never  Vaping Use   Vaping Use: Never used  Substance Use Topics   Alcohol use: Yes    Comment: 7   Drug use: No     Colonoscopy: January 2017/man  PAP: 2014  Bone density: 08/2017, -0.8   No Known Allergies  Current Outpatient Medications  Medication Sig Dispense Refill   anastrozole (ARIMIDEX) 1 MG tablet Take 1 tablet (1 mg total) by mouth daily. 90 tablet 3   No current facility-administered medications for this visit.    OBJECTIVE: White woman who appears younger  than stated age  There were no vitals filed for this visit.    There is no height or weight on file to calculate BMI.    Physical exam deferred, telephone visit LAB RESULTS:  CMP     Component Value Date/Time   NA 135 08/03/2022 0838   NA 137 09/21/2017 1054   K 4.6 08/03/2022 0838   K 4.4 09/21/2017 1054   CL 100 08/03/2022 0838   CO2 28 08/03/2022 0838   CO2 25 09/21/2017 1054   GLUCOSE 105 (H) 08/03/2022 0838   GLUCOSE 101 09/21/2017 1054   BUN 12 08/03/2022 0838   BUN 12.6 09/21/2017 1054   CREATININE 0.90 08/03/2022 0838   CREATININE 0.8 09/21/2017 1054   CALCIUM 9.6 08/03/2022 0838   CALCIUM 9.7 09/21/2017 1054   PROT 7.8 08/03/2022 0838   PROT 7.8 09/21/2017 1054   ALBUMIN 4.8 08/03/2022 0838   ALBUMIN 4.1 09/21/2017 1054   AST 19 08/03/2022 0838   AST 22 09/21/2017 1054   ALT 14 08/03/2022 0838   ALT 21 09/21/2017 1054   ALKPHOS 53 08/03/2022 0838   ALKPHOS 81 09/21/2017 1054   BILITOT 0.7 08/03/2022 0838    BILITOT 0.45 09/21/2017 1054   GFRNONAA >60 08/03/2022 0838   GFRAA >60 07/09/2020 1408   GFRAA >60 12/05/2018 1027    No results found for: "TOTALPROTELP", "ALBUMINELP", "A1GS", "A2GS", "BETS", "BETA2SER", "GAMS", "MSPIKE", "SPEI"  No results found for: "KPAFRELGTCHN", "LAMBDASER", "KAPLAMBRATIO"  Lab Results  Component Value Date   WBC 5.4 08/03/2022   NEUTROABS 3.7 08/03/2022   HGB 13.6 08/03/2022   HCT 39.6 08/03/2022   MCV 95.4 08/03/2022   PLT 175 08/03/2022      Chemistry      Component Value Date/Time   NA 135 08/03/2022 0838   NA 137 09/21/2017 1054   K 4.6 08/03/2022 0838   K 4.4 09/21/2017 1054   CL 100 08/03/2022 0838   CO2 28 08/03/2022 0838   CO2 25 09/21/2017 1054   BUN 12 08/03/2022 0838   BUN 12.6 09/21/2017 1054   CREATININE 0.90 08/03/2022 0838   CREATININE 0.8 09/21/2017 1054      Component Value Date/Time   CALCIUM 9.6 08/03/2022 0838   CALCIUM 9.7 09/21/2017 1054   ALKPHOS 53 08/03/2022 0838   ALKPHOS 81 09/21/2017 1054   AST 19 08/03/2022 0838   AST 22 09/21/2017 1054   ALT 14 08/03/2022 0838   ALT 21 09/21/2017 1054   BILITOT 0.7 08/03/2022 0838   BILITOT 0.45 09/21/2017 1054       No results found for: "LABCA2"  No components found for: "AXKPVV748"  No results for input(s): "INR" in the last 168 hours.  Urinalysis No results found for: "COLORURINE", "APPEARANCEUR", "LABSPEC", "PHURINE", "GLUCOSEU", "HGBUR", "BILIRUBINUR", "KETONESUR", "PROTEINUR", "UROBILINOGEN", "NITRITE", "LEUKOCYTESUR"   STUDIES: No results found.   ELIGIBLE FOR AVAILABLE RESEARCH PROTOCOL: no  ASSESSMENT: 71 y.o. [place] woman status post right breast upper inner quadrant biopsy 01/11/2017 for a clinical  T1c N0, stage IA invasive ductal carcinoma, grade 1, estrogen and progesterone receptor positive, HER-2 nonamplified, with an MIB-1 of 5%.   (1) Left lumpectomy and sentinel lymph node sampling 03/05/2017 showed a pT1c pN0 invasive ductal carcinoma,  grade 1, with close but negative margins, estrogen receptor strongly positive, progesterone receptor negative, with an MIB-1 of 5% and no HER-2 amplification.  (a) left axillary sentinel lymph node biopsy 03/29/2017 showed no lymph node involvement  (2) right lumpectomy 03/05/2017 showed a pT1c  pN0 invasive ductal carcinoma, grade 1, with negative margins  (3) Oncotype DX scores of 19-21 predicts a 10 year risk of recurrence outside the breast of 12-13% if the patient's only systemic therapy is tamoxifen for 5 years. It also predicts no benefit from chemotherapy.  (4) adjuvant radiation completed 05/26/2017  (5) started anastrozole 07/15/2017  (a) bone density at [facility] 09/22/2017 showed a T score of -0.8  (b) bone density 12/20/2019 shows a T score of -0.3, normal  (6) genetics testing 02/15/2017 through [facility]'s Common Hereditary Cancers Panel showed no deleterious mutations in APC, ATM, AXIN2, BARD1, BMPR1A, BRCA1, BRCA2, BRIP1, CDH1, CDKN2A, CHEK2, CTNNA1, DICER1, EPCAM, GREM1, HOXB13, KIT, MEN1, MLH1, MSH2, MSH3, MSH6, MUTYH, NBN, NF1, NTHL1, PALB2, PDGFRA, PMS2, POLD1, POLE, PTEN, RAD50, RAD51C, RAD51D, SDHA, SDHB, SDHC, SDHD, SMAD4, SMARCA4, STK11, TP53, TSC1, TSC2, and VHL.   (a) Variants of uncertain significance (VUSs) were identified in three genes.    DICER1 c.2027G>C (p.Arg676Pro)   TSC2 c.4325A>T (p.Glu1442Val)   CTNNA1 c.410G>A (p.Arg137Gln)  (7) breast density category D  (a) yearly breast MRI alternating with yearly mammography   PLAN:.  Patient is wondering if she would like to continue anastrozole beyond 5 years.   We have reviewed her original pathology which showed grade 1 bilateral breast cancers, ER positive PR negative.  We have discussed about 5 years versus 7 years versus 10 years of antiestrogen therapy.   She otherwise will continue doing MRIs alternating with mammograms, her last MRI was in March . We scheduled this telephone visit today to review  breast cancer index results but I had to leave a voicemail twice. At this time there is no benefit from extended endocrine therapy. She was instructed to give [place] a call back with any new questions or concerns. Total time spent: 6 minutes   *Total Encounter Time as defined by the Centers for Medicare and Medicaid Services includes, in addition to the face-to-face time of a patient visit (documented in the note above) non-face-to-face time: obtaining and reviewing outside history, ordering and reviewing medications, tests or procedures, care coordination (communications with other health care professionals or caregivers) and documentation in the medical record.

## 2022-09-07 ENCOUNTER — Encounter (HOSPITAL_COMMUNITY): Payer: Self-pay

## 2022-09-11 ENCOUNTER — Encounter: Payer: Self-pay | Admitting: *Deleted

## 2022-09-24 ENCOUNTER — Other Ambulatory Visit: Payer: Self-pay | Admitting: Hematology and Oncology

## 2022-09-24 ENCOUNTER — Ambulatory Visit
Admission: RE | Admit: 2022-09-24 | Discharge: 2022-09-24 | Disposition: A | Discharge status: Home / Self Care | Payer: Medicare Other | Source: Ambulatory Visit | Attending: Hematology and Oncology | Admitting: Hematology and Oncology

## 2022-09-24 DIAGNOSIS — Z1231 Encounter for screening mammogram for malignant neoplasm of breast: Secondary | ICD-10-CM | POA: Diagnosis not present

## 2022-09-24 DIAGNOSIS — Z170 Estrogen receptor positive status [ER+]: Secondary | ICD-10-CM

## 2023-02-16 DIAGNOSIS — L814 Other melanin hyperpigmentation: Secondary | ICD-10-CM | POA: Diagnosis not present

## 2023-02-16 DIAGNOSIS — D2371 Other benign neoplasm of skin of right lower limb, including hip: Secondary | ICD-10-CM | POA: Diagnosis not present

## 2023-02-16 DIAGNOSIS — Z85828 Personal history of other malignant neoplasm of skin: Secondary | ICD-10-CM | POA: Diagnosis not present

## 2023-02-16 DIAGNOSIS — D1801 Hemangioma of skin and subcutaneous tissue: Secondary | ICD-10-CM | POA: Diagnosis not present

## 2023-02-16 DIAGNOSIS — D2372 Other benign neoplasm of skin of left lower limb, including hip: Secondary | ICD-10-CM | POA: Diagnosis not present

## 2023-02-16 DIAGNOSIS — I788 Other diseases of capillaries: Secondary | ICD-10-CM | POA: Diagnosis not present

## 2023-02-16 DIAGNOSIS — L821 Other seborrheic keratosis: Secondary | ICD-10-CM | POA: Diagnosis not present

## 2023-03-10 DIAGNOSIS — H524 Presbyopia: Secondary | ICD-10-CM | POA: Diagnosis not present

## 2023-03-10 DIAGNOSIS — H53143 Visual discomfort, bilateral: Secondary | ICD-10-CM | POA: Diagnosis not present

## 2023-03-10 DIAGNOSIS — H5203 Hypermetropia, bilateral: Secondary | ICD-10-CM | POA: Diagnosis not present

## 2023-03-10 DIAGNOSIS — H2513 Age-related nuclear cataract, bilateral: Secondary | ICD-10-CM | POA: Diagnosis not present

## 2023-03-10 DIAGNOSIS — H43393 Other vitreous opacities, bilateral: Secondary | ICD-10-CM | POA: Diagnosis not present

## 2023-03-10 DIAGNOSIS — H52223 Regular astigmatism, bilateral: Secondary | ICD-10-CM | POA: Diagnosis not present

## 2023-05-17 DIAGNOSIS — H25043 Posterior subcapsular polar age-related cataract, bilateral: Secondary | ICD-10-CM | POA: Diagnosis not present

## 2023-05-17 DIAGNOSIS — H43393 Other vitreous opacities, bilateral: Secondary | ICD-10-CM | POA: Diagnosis not present

## 2023-05-17 DIAGNOSIS — H2511 Age-related nuclear cataract, right eye: Secondary | ICD-10-CM | POA: Diagnosis not present

## 2023-05-17 DIAGNOSIS — H2513 Age-related nuclear cataract, bilateral: Secondary | ICD-10-CM | POA: Diagnosis not present

## 2023-05-17 DIAGNOSIS — H25013 Cortical age-related cataract, bilateral: Secondary | ICD-10-CM | POA: Diagnosis not present

## 2023-06-24 DIAGNOSIS — Z23 Encounter for immunization: Secondary | ICD-10-CM | POA: Diagnosis not present

## 2023-06-24 DIAGNOSIS — Z0000 Encounter for general adult medical examination without abnormal findings: Secondary | ICD-10-CM | POA: Diagnosis not present

## 2023-06-24 DIAGNOSIS — Z6822 Body mass index (BMI) 22.0-22.9, adult: Secondary | ICD-10-CM | POA: Diagnosis not present

## 2023-06-25 DIAGNOSIS — Z136 Encounter for screening for cardiovascular disorders: Secondary | ICD-10-CM | POA: Diagnosis not present

## 2023-06-25 DIAGNOSIS — Z0000 Encounter for general adult medical examination without abnormal findings: Secondary | ICD-10-CM | POA: Diagnosis not present

## 2023-07-31 DIAGNOSIS — H2511 Age-related nuclear cataract, right eye: Secondary | ICD-10-CM | POA: Diagnosis not present

## 2023-08-03 DIAGNOSIS — H2512 Age-related nuclear cataract, left eye: Secondary | ICD-10-CM | POA: Diagnosis not present

## 2023-08-03 DIAGNOSIS — H269 Unspecified cataract: Secondary | ICD-10-CM | POA: Diagnosis not present

## 2023-08-03 DIAGNOSIS — H25042 Posterior subcapsular polar age-related cataract, left eye: Secondary | ICD-10-CM | POA: Diagnosis not present

## 2023-08-03 DIAGNOSIS — H2511 Age-related nuclear cataract, right eye: Secondary | ICD-10-CM | POA: Diagnosis not present

## 2023-08-10 DIAGNOSIS — H269 Unspecified cataract: Secondary | ICD-10-CM | POA: Diagnosis not present

## 2023-08-10 DIAGNOSIS — H2512 Age-related nuclear cataract, left eye: Secondary | ICD-10-CM | POA: Diagnosis not present

## 2023-08-19 ENCOUNTER — Other Ambulatory Visit: Payer: Self-pay | Admitting: Family Medicine

## 2023-08-19 DIAGNOSIS — Z1231 Encounter for screening mammogram for malignant neoplasm of breast: Secondary | ICD-10-CM

## 2023-09-07 DIAGNOSIS — H2511 Age-related nuclear cataract, right eye: Secondary | ICD-10-CM | POA: Diagnosis not present

## 2023-09-07 DIAGNOSIS — Z961 Presence of intraocular lens: Secondary | ICD-10-CM | POA: Diagnosis not present

## 2023-09-29 ENCOUNTER — Ambulatory Visit
Admission: RE | Admit: 2023-09-29 | Discharge: 2023-09-29 | Disposition: A | Discharge status: Home / Self Care | Payer: Medicare Other | Source: Ambulatory Visit | Attending: Family Medicine | Admitting: Family Medicine

## 2023-09-29 DIAGNOSIS — Z1231 Encounter for screening mammogram for malignant neoplasm of breast: Secondary | ICD-10-CM

## 2024-02-02 DIAGNOSIS — H26493 Other secondary cataract, bilateral: Secondary | ICD-10-CM | POA: Diagnosis not present

## 2024-02-02 DIAGNOSIS — H43393 Other vitreous opacities, bilateral: Secondary | ICD-10-CM | POA: Diagnosis not present

## 2024-02-02 DIAGNOSIS — Z961 Presence of intraocular lens: Secondary | ICD-10-CM | POA: Diagnosis not present

## 2024-02-02 DIAGNOSIS — H26491 Other secondary cataract, right eye: Secondary | ICD-10-CM | POA: Diagnosis not present

## 2024-02-09 DIAGNOSIS — Z9841 Cataract extraction status, right eye: Secondary | ICD-10-CM | POA: Diagnosis not present

## 2024-02-09 DIAGNOSIS — Z961 Presence of intraocular lens: Secondary | ICD-10-CM | POA: Diagnosis not present

## 2024-02-09 DIAGNOSIS — H2511 Age-related nuclear cataract, right eye: Secondary | ICD-10-CM | POA: Diagnosis not present

## 2024-03-09 DIAGNOSIS — I788 Other diseases of capillaries: Secondary | ICD-10-CM | POA: Diagnosis not present

## 2024-03-09 DIAGNOSIS — Z85828 Personal history of other malignant neoplasm of skin: Secondary | ICD-10-CM | POA: Diagnosis not present

## 2024-03-09 DIAGNOSIS — L820 Inflamed seborrheic keratosis: Secondary | ICD-10-CM | POA: Diagnosis not present

## 2024-08-16 ENCOUNTER — Other Ambulatory Visit: Payer: Self-pay | Admitting: Family Medicine

## 2024-08-16 DIAGNOSIS — Z13220 Encounter for screening for lipoid disorders: Secondary | ICD-10-CM | POA: Diagnosis not present

## 2024-08-16 DIAGNOSIS — Z1331 Encounter for screening for depression: Secondary | ICD-10-CM | POA: Diagnosis not present

## 2024-08-16 DIAGNOSIS — Z0000 Encounter for general adult medical examination without abnormal findings: Secondary | ICD-10-CM | POA: Diagnosis not present

## 2024-08-16 DIAGNOSIS — Z1231 Encounter for screening mammogram for malignant neoplasm of breast: Secondary | ICD-10-CM

## 2024-08-16 DIAGNOSIS — C50911 Malignant neoplasm of unspecified site of right female breast: Secondary | ICD-10-CM | POA: Diagnosis not present

## 2024-08-21 ENCOUNTER — Other Ambulatory Visit: Payer: Self-pay | Admitting: Family Medicine

## 2024-08-21 DIAGNOSIS — C50911 Malignant neoplasm of unspecified site of right female breast: Secondary | ICD-10-CM

## 2024-08-21 DIAGNOSIS — Z170 Estrogen receptor positive status [ER+]: Secondary | ICD-10-CM

## 2024-09-05 DIAGNOSIS — L900 Lichen sclerosus et atrophicus: Secondary | ICD-10-CM | POA: Diagnosis not present

## 2024-09-27 ENCOUNTER — Ambulatory Visit
Admission: RE | Admit: 2024-09-27 | Discharge: 2024-09-27 | Disposition: A | Source: Ambulatory Visit | Attending: Family Medicine | Admitting: Family Medicine

## 2024-09-27 DIAGNOSIS — C50911 Malignant neoplasm of unspecified site of right female breast: Secondary | ICD-10-CM

## 2024-09-27 DIAGNOSIS — Z170 Estrogen receptor positive status [ER+]: Secondary | ICD-10-CM

## 2024-09-27 DIAGNOSIS — Z1239 Encounter for other screening for malignant neoplasm of breast: Secondary | ICD-10-CM | POA: Diagnosis not present

## 2024-09-27 MED ORDER — GADOPICLENOL 0.5 MMOL/ML IV SOLN
6.0000 mL | Freq: Once | INTRAVENOUS | Status: AC | PRN
  Administered 2024-09-27: 6 mL via INTRAVENOUS

## 2024-09-28 DIAGNOSIS — Z136 Encounter for screening for cardiovascular disorders: Secondary | ICD-10-CM | POA: Diagnosis not present

## 2024-09-28 DIAGNOSIS — Z13220 Encounter for screening for lipoid disorders: Secondary | ICD-10-CM | POA: Diagnosis not present

## 2024-09-28 DIAGNOSIS — C50911 Malignant neoplasm of unspecified site of right female breast: Secondary | ICD-10-CM | POA: Diagnosis not present

## 2024-10-02 DIAGNOSIS — L900 Lichen sclerosus et atrophicus: Secondary | ICD-10-CM | POA: Diagnosis not present

## 2024-10-05 ENCOUNTER — Ambulatory Visit
